# Patient Record
Sex: Female | Born: 1935
Health system: Southern US, Community
[De-identification: ages and names within clinical notes are randomized; demographics above are authoritative.]

## PROBLEM LIST (undated history)

## (undated) DIAGNOSIS — F32A Depression, unspecified: Secondary | ICD-10-CM

## (undated) DIAGNOSIS — E785 Hyperlipidemia, unspecified: Secondary | ICD-10-CM

## (undated) DIAGNOSIS — D649 Anemia, unspecified: Secondary | ICD-10-CM

## (undated) DIAGNOSIS — C50919 Malignant neoplasm of unspecified site of unspecified female breast: Secondary | ICD-10-CM

## (undated) DIAGNOSIS — C4490 Unspecified malignant neoplasm of skin, unspecified: Secondary | ICD-10-CM

## (undated) DIAGNOSIS — N9489 Other specified conditions associated with female genital organs and menstrual cycle: Secondary | ICD-10-CM

## (undated) DIAGNOSIS — I10 Essential (primary) hypertension: Secondary | ICD-10-CM

## (undated) DIAGNOSIS — G4730 Sleep apnea, unspecified: Secondary | ICD-10-CM

## (undated) DIAGNOSIS — C189 Malignant neoplasm of colon, unspecified: Secondary | ICD-10-CM

## (undated) DIAGNOSIS — K219 Gastro-esophageal reflux disease without esophagitis: Secondary | ICD-10-CM

## (undated) HISTORY — PX: COLONOSCOPY: SHX174

## (undated) HISTORY — PX: BREAST BIOPSY: SHX20

---

## 1993-08-22 HISTORY — PX: COLON RESECTION: SHX5231

## 2004-10-20 ENCOUNTER — Ambulatory Visit: Payer: Self-pay | Admitting: Internal Medicine

## 2005-10-25 ENCOUNTER — Ambulatory Visit: Payer: Self-pay | Admitting: Internal Medicine

## 2005-10-27 ENCOUNTER — Ambulatory Visit: Payer: Self-pay | Admitting: Internal Medicine

## 2006-02-02 ENCOUNTER — Ambulatory Visit: Payer: Self-pay | Admitting: Unknown Physician Specialty

## 2006-05-10 ENCOUNTER — Ambulatory Visit: Payer: Self-pay | Admitting: Internal Medicine

## 2006-10-27 ENCOUNTER — Ambulatory Visit: Payer: Self-pay | Admitting: Internal Medicine

## 2007-12-11 ENCOUNTER — Ambulatory Visit: Payer: Self-pay | Admitting: Internal Medicine

## 2008-01-15 ENCOUNTER — Ambulatory Visit: Payer: Self-pay | Admitting: Unknown Physician Specialty

## 2008-04-10 ENCOUNTER — Ambulatory Visit: Payer: Self-pay | Admitting: Internal Medicine

## 2008-04-28 ENCOUNTER — Ambulatory Visit: Payer: Self-pay | Admitting: Internal Medicine

## 2008-12-16 ENCOUNTER — Ambulatory Visit: Payer: Self-pay | Admitting: Internal Medicine

## 2010-01-01 ENCOUNTER — Ambulatory Visit: Payer: Self-pay | Admitting: Internal Medicine

## 2011-01-19 ENCOUNTER — Ambulatory Visit: Payer: Self-pay | Admitting: Internal Medicine

## 2012-02-15 ENCOUNTER — Ambulatory Visit: Payer: Self-pay | Admitting: Internal Medicine

## 2012-05-14 ENCOUNTER — Ambulatory Visit: Payer: Self-pay | Admitting: Unknown Physician Specialty

## 2013-12-25 ENCOUNTER — Ambulatory Visit: Payer: Self-pay | Admitting: Unknown Physician Specialty

## 2014-01-14 ENCOUNTER — Ambulatory Visit: Payer: Self-pay | Admitting: Internal Medicine

## 2014-03-03 ENCOUNTER — Ambulatory Visit: Payer: Self-pay | Admitting: Unknown Physician Specialty

## 2014-03-06 LAB — PATHOLOGY REPORT

## 2014-08-28 ENCOUNTER — Ambulatory Visit: Payer: Self-pay | Admitting: Specialist

## 2014-09-09 ENCOUNTER — Ambulatory Visit: Payer: Self-pay | Admitting: Internal Medicine

## 2014-09-11 ENCOUNTER — Ambulatory Visit: Payer: Self-pay | Admitting: Obstetrics and Gynecology

## 2014-09-11 LAB — BASIC METABOLIC PANEL
ANION GAP: 8 (ref 7–16)
BUN: 34 mg/dL — ABNORMAL HIGH (ref 7–18)
CHLORIDE: 107 mmol/L (ref 98–107)
CREATININE: 1.01 mg/dL (ref 0.60–1.30)
Calcium, Total: 8.7 mg/dL (ref 8.5–10.1)
Co2: 25 mmol/L (ref 21–32)
EGFR (African American): >60
EGFR (Non-African Amer.): 57 — ABNORMAL LOW
Glucose: 76 mg/dL (ref 65–99)
OSMOLALITY: 286 (ref 275–301)
Potassium: 4.5 mmol/L (ref 3.5–5.1)
Sodium: 140 mmol/L (ref 136–145)

## 2014-09-11 LAB — HEMOGLOBIN: HGB: 12.3 g/dL (ref 12.0–16.0)

## 2014-09-18 ENCOUNTER — Ambulatory Visit: Payer: Self-pay | Admitting: Anesthesiology

## 2014-09-22 ENCOUNTER — Ambulatory Visit: Payer: Self-pay | Admitting: Obstetrics and Gynecology

## 2014-09-24 LAB — CA 125: CA 125: 20.5 U/mL (ref 0.0–34.0)

## 2014-12-05 ENCOUNTER — Other Ambulatory Visit: Payer: Self-pay | Admitting: Specialist

## 2014-12-05 DIAGNOSIS — R918 Other nonspecific abnormal finding of lung field: Secondary | ICD-10-CM

## 2014-12-15 LAB — SURGICAL PATHOLOGY

## 2014-12-21 NOTE — Op Note (Signed)
PATIENT NAME:  Martha Munoz, Martha Munoz MR#:  [id number] DATE OF BIRTH:  [birth year]  DATE OF PROCEDURE:  09/22/2014   PREOPERATIVE DIAGNOSIS: Complex right ovarian cyst.   POSTOPERATIVE DIAGNOSES:  1.  Complex right ovarian cyst.  2.  Significant pelvic and abdominal adhesions.   PROCEDURES:  1.  Laparoscopic bilateral salpingectomy. 2.  Laparoscopic lysis of adhesions encompassing greater than 50% of total operating time.   ANESTHESIA:  General endotracheal.   SURGEON: Boykin Nearing, MD   FIRST ASSISTANTLeafy Ro.   INDICATIONS: This is a 79 year old gravida 0 patient noted to have a complex right ovarian cyst with papillation noted. The patient has been consented for bilateral salpingo-oophorectomy. The patient is status post partial colon resection.   DESCRIPTION OF PROCEDURE: After adequate general endotracheal anesthesia, the patient was placed in the dorsal supine position with the legs in the [place] stirrups. SCDs were on each leg bilaterally. The patient's abdomen, perineum, and vagina were prepped. The patient was sterilely draped.  Straight catheterization of the bladder yielded 100 mL of clear urine.  A sponge stick was placed into the vagina to be used for uterine manipulation during the procedure.  A 12 mm infraumbilical incision was made after injecting with 0.5% Marcaine and 0.25% Marcaine. The laparoscope was advanced into the abdominal cavity under direct visualization with the Optiview cannula. The patient was placed in slight Trendelenburg and second port was placed the left lower quadrant, 11 mm port was advanced 3 cm medial to the left anterior iliac spine and under direct visualization the 11 mm port was advanced into the abdominal cavity.  A similar procedure was performed with a 5 mm port on the right lower quadrant, again the port was placed 3 cm medial to the right anterior iliac spine.  Initial impression showed multiple adhesions with the left colon adhesed to the  left pelvic sidewall, multiple dense adhesions noted there and multiple small bowel adhesions draped and adhesed to the anterior abdominal wall. Very close to the infraumbilical port site.  The patient's right fallopian tube and ovary were identified and a 3 x 2 cm ovarian cyst without any excrescences were noted.  The fallopian tube was placed on medial traction, while the infundibulopelvic ligament was cauterized and transected with the Harmonic scalpel. Right fallopian tube and ovary were removed intact.  A similar procedure was performed on the patient's left ovary after an adhesiolysis occurred of the colon to the left sidewall and ultimately the infundibulopelvic ligament was identified.  It was cauterized and transected and the left fallopian tube and ovary were removed.  Ureteral function was ascertained before and after the bilateral salpingo-oophorectomy.  Good hemostasis was noted. Additional adhesiolysis was performed on the small bowel adhesed to the anterior abdominal wall.  Very close proximity of the bowel to the abdominal wall required meticulous dissection. Ultimately, the small bowel was freed and good hemostasis was noted. The patient's abdomen was irrigated and suctioned.  The pressure was lowered to 7 mmHg and good hemostasis was noted. The left and right fallopian tube and ovaries were removed with the Endobag through the left lower port site.  The patient's abdomen was then deflated and all ports were removed. The infraumbilical fascia and the left lower port site fascia was closed with a 2-0 Vicryl suture and all skin incisions were closed with interrupted 4-0 Vicryl suture.  Dermabond was placed on all incisions with a Tegaderm dressing. The Foley catheter was removed.    TOTAL URINE OUTPUT:  250 mL. Sponge stick was removed.   INTRAOPERATIVE FLUIDS: 800 mL.   ESTIMATED BLOOD LOSS: 5 mL   DISPOSITION:  The patient tolerated the procedure well and was taken to the recovery room in  good condition.     ____________________________ Boykin Nearing, MD tjs:DT D: 09/22/2014 13:25:00 ET T: 09/22/2014 15:05:26 ET JOB#: [id number]  cc: Boykin Nearing, MD, <Dictator> Boykin Nearing MD ELECTRONICALLY SIGNED 09/26/2014 12:46

## 2015-02-13 ENCOUNTER — Other Ambulatory Visit: Payer: Self-pay

## 2015-02-13 ENCOUNTER — Emergency Department: Payer: Medicare Other

## 2015-02-13 ENCOUNTER — Emergency Department
Admission: EM | Admit: 2015-02-13 | Discharge: 2015-02-13 | Disposition: A | Discharge status: Home / Self Care | Payer: Medicare Other | Attending: Emergency Medicine | Admitting: Emergency Medicine

## 2015-02-13 ENCOUNTER — Encounter: Payer: Self-pay | Admitting: Emergency Medicine

## 2015-02-13 DIAGNOSIS — K222 Esophageal obstruction: Secondary | ICD-10-CM | POA: Insufficient documentation

## 2015-02-13 DIAGNOSIS — R0789 Other chest pain: Secondary | ICD-10-CM | POA: Diagnosis present

## 2015-02-13 DIAGNOSIS — I10 Essential (primary) hypertension: Secondary | ICD-10-CM | POA: Insufficient documentation

## 2015-02-13 DIAGNOSIS — R12 Heartburn: Secondary | ICD-10-CM | POA: Diagnosis not present

## 2015-02-13 DIAGNOSIS — Z792 Long term (current) use of antibiotics: Secondary | ICD-10-CM | POA: Diagnosis not present

## 2015-02-13 DIAGNOSIS — Z79899 Other long term (current) drug therapy: Secondary | ICD-10-CM | POA: Insufficient documentation

## 2015-02-13 HISTORY — DX: Essential (primary) hypertension: I10

## 2015-02-13 HISTORY — DX: Hyperlipidemia, unspecified: E78.5

## 2015-02-13 HISTORY — DX: Malignant neoplasm of colon, unspecified: C18.9

## 2015-02-13 LAB — CBC
HCT: 36.1 % (ref 35.0–47.0)
Hemoglobin: 11.8 g/dL — ABNORMAL LOW (ref 12.0–16.0)
MCH: 31 pg (ref 26.0–34.0)
MCHC: 32.9 g/dL (ref 32.0–36.0)
MCV: 94.3 fL (ref 80.0–100.0)
PLATELETS: 270 10*3/uL (ref 150–440)
RBC: 3.83 MIL/uL (ref 3.80–5.20)
RDW: 13.3 % (ref 11.5–14.5)
WBC: 5.6 10*3/uL (ref 3.6–11.0)

## 2015-02-13 LAB — COMPREHENSIVE METABOLIC PANEL
ALT: 13 U/L — AB (ref 14–54)
AST: 24 U/L (ref 15–41)
Albumin: 3.7 g/dL (ref 3.5–5.0)
Alkaline Phosphatase: 65 U/L (ref 38–126)
Anion gap: 8 (ref 5–15)
BUN: 27 mg/dL — ABNORMAL HIGH (ref 6–20)
CO2: 25 mmol/L (ref 22–32)
Calcium: 9.1 mg/dL (ref 8.9–10.3)
Chloride: 107 mmol/L (ref 101–111)
Creatinine, Ser: 0.87 mg/dL (ref 0.44–1.00)
GFR calc Af Amer: >60 mL/min (ref >60)
GFR calc non Af Amer: >60 mL/min (ref >60)
Glucose, Bld: 103 mg/dL — ABNORMAL HIGH (ref 65–99)
POTASSIUM: 4.5 mmol/L (ref 3.5–5.1)
SODIUM: 140 mmol/L (ref 135–145)
TOTAL PROTEIN: 6.6 g/dL (ref 6.5–8.1)
Total Bilirubin: 0.7 mg/dL (ref 0.3–1.2)

## 2015-02-13 LAB — TROPONIN I

## 2015-02-13 NOTE — ED Notes (Signed)
Patient transported to X-ray 

## 2015-02-13 NOTE — ED Provider Notes (Signed)
[facility] Emergency Department Provider Note  ____________________________________________  Time seen: On arrival  I have reviewed the triage vital signs and the nursing notes.   HISTORY  Chief Complaint Chest Pain and Heartburn     HPI Martha Munoz is a 79 y.o. female who presents with complaints of swallowing difficulty for approximately one week. Patient reports that anytime she swallows liquid or solid she develops a discomfort in her lower chest just above the epigastrium. She has never had this before. She has had heartburn for many years. Her last endoscopy was 2 years ago. She sees Dr. Vira Agar. She has no nausea no vomiting. No shortness of breath. No history of coronary artery disease. No fevers or chills. She only has the pain when swallowing. She was sent from urgent care for eval     Past Medical History  Diagnosis Date  . Hypertension   . Hyperlipidemia   . Colon cancer     There are no active problems to display for this patient.   History reviewed. No pertinent past surgical history.  Current Outpatient Rx  Name  Route  Sig  Dispense  Refill  . cetirizine (ZYRTEC) 10 MG tablet   Oral   Take 10 mg by mouth daily.         Marland Kitchen doxycycline (VIBRAMYCIN) 100 MG capsule   Oral   Take 100 mg by mouth daily.         Marland Kitchen losartan (COZAAR) 100 MG tablet   Oral   Take 100 mg by mouth daily.         . metoprolol succinate (TOPROL-XL) 50 MG 24 hr tablet   Oral   Take 25 mg by mouth daily. Take with or immediately following a meal.         . ranitidine (ZANTAC) 150 MG tablet   Oral   Take 150 mg by mouth daily as needed for heartburn.         . simvastatin (ZOCOR) 20 MG tablet   Oral   Take 20 mg by mouth daily.           Allergies Review of patient's allergies indicates no known allergies.  Family History  Problem Relation Age of Onset  . CVA Mother   . Heart attack Father     Social History History   Substance Use Topics  . Smoking status: Never Smoker   . Smokeless tobacco: Not on file  . Alcohol Use: Yes    Review of Systems  Constitutional: Negative for fever. Eyes: Negative for visual changes. ENT: Negative for sore throat Cardiovascular: As discomfort Respiratory: Negative for shortness of breath. Gastrointestinal: Negative for abdominal pain, vomiting and diarrhea. Difficulty swallowing Genitourinary: Negative for dysuria. Musculoskeletal: Negative for back pain. Skin: Negative for rash. Neurological: Negative for headaches or focal weakness Psychiatric: No anxiety  10-point ROS otherwise negative.  ____________________________________________   PHYSICAL EXAM:  VITAL SIGNS: ED Triage Vitals  Enc Vitals Group     BP 02/13/15 0941 146/98 mmHg     Pulse Rate 02/13/15 0941 72     Resp 02/13/15 0941 20     Temp 02/13/15 0941 98 F (36.7 C)     Temp Source 02/13/15 0941 Oral     SpO2 02/13/15 0941 96 %     Weight 02/13/15 0941 138 lb (62.596 kg)     Height 02/13/15 0941 5\' 1"  (1.549 m)     Head Cir --      Peak  Flow --      Pain Score 02/13/15 0942 3     Pain Loc --      Pain Edu? --      Excl. in [place]? --     Constitutional: Alert and oriented. Well appearing and in no distress. Eyes: Conjunctivae are normal.  ENT   Head: Normocephalic and atraumatic.   Mouth/Throat: Mucous membranes are moist. Cardiovascular: Normal rate, regular rhythm. Normal and symmetric distal pulses are present in all extremities. No murmurs, rubs, or gallops. Respiratory: Normal respiratory effort without tachypnea nor retractions. Breath sounds are clear and equal bilaterally.  Gastrointestinal: Soft and non-tender in all quadrants. No distention. There is no CVA tenderness. Genitourinary: deferred Musculoskeletal: Nontender with normal range of motion in all extremities. No lower extremity tenderness nor edema. Neurologic:  Normal speech and language. No gross focal  neurologic deficits are appreciated. Skin:  Skin is warm, dry and intact. No rash noted. Psychiatric: Mood and affect are normal. Patient exhibits appropriate insight and judgment.  ____________________________________________    LABS (pertinent positives/negatives)  Labs Reviewed  CBC - Abnormal; Notable for the following:    Hemoglobin 11.8 (*)    All other components within normal limits  COMPREHENSIVE METABOLIC PANEL - Abnormal; Notable for the following:    Glucose, Bld 103 (*)    BUN 27 (*)    ALT 13 (*)    All other components within normal limits  TROPONIN I    ____________________________________________   EKG   ED ECG REPORT I, Lavonia Drafts, the attending physician, personally viewed and interpreted this ECG.   Date: 02/13/2015  EKG Time: 9:43 AM  Rate: 72  Rhythm: normal sinus rhythm, left axis deviation  Axis: Left Axis deviation  Intervals:none  ST&T Change: None   ____________________________________________    RADIOLOGY  Chest x-ray unremarkable, reviewed by me  ____________________________________________   PROCEDURES  Procedure(s) performed: none  Critical Care performed: none  ____________________________________________   INITIAL IMPRESSION / ASSESSMENT AND PLAN / ED COURSE  Pertinent labs & imaging results that were available during my care of the patient were reviewed by me and considered in my medical decision making (see chart for details).  Patient well-appearing. Chest discomfort not consistent with ACS, more consistent with likely esophageal stricture. Patient has relationship with Dr. Vira Agar will refer her to him. EKG troponin negative chest x-ray normal. Patient anxious for discharge    ____________________________________________   FINAL CLINICAL IMPRESSION(S) / ED DIAGNOSES  Final diagnoses:  Esophageal stricture     Lavonia Drafts, MD 02/13/15 1220

## 2015-02-13 NOTE — ED Notes (Signed)
Pt to ed with c/o heartburn intermittently x 1 week.  Pt states she has pain more after eating in center of chest and describes pain as burning.  Pt denies sob, denies weakness, denies dizziness assoc with chest pain.

## 2015-02-13 NOTE — Discharge Instructions (Signed)
Esophageal Stricture °The esophagus is the long, narrow tube which carries food and liquid from the mouth to the stomach. Sometimes a part of the esophagus becomes narrow and makes it difficult, painful, or even impossible to swallow. This is called an esophageal stricture.  °CAUSES  °Common causes of blockage or strictures of the esophagus are: °· Exposure of the lower esophagus to the acid from the stomach may cause narrowing. °· Hiatal hernia in which a small part of the stomach bulges up through the diaphragm can cause a narrowing in the bottom of the esophagus. °· Scleroderma is a tissue disorder that affects the esophagus and makes swallowing difficult. °· Achalasia is an absence of nerves in the lower esophagus and to the esophageal sphincter. This absence of nerves may be congenital (present since birth). This can cause irregular spasms which do not allow food and fluid through. °· Strictures may develop from swallowing materials which damage the esophagus. Examples are acids or alkalis such as lye. °· Schatzki's Ring is a narrow ring of non-cancerous tissue which narrows the lower esophagus. The cause of this is unknown. °· Growths can block the esophagus. °SYMPTOMS  °Some of the problems are difficulty swallowing or pain with swallowing. °DIAGNOSIS  °Your caregiver often suspects this problem by taking a medical history. They will also do a physical exam. They may then take X-rays and/or perform an endoscopy. Endoscopy is an exam in which a tube like a small flexible telescope is used to look at your esophagus.  °TREATMENT °· One form of treatment is to dilate the narrow area. This means to stretch it. °· When this is not successful, chest surgery may be required. This is a much more extensive form of treatment with a longer recovery time. °Both of the above treatments make the passage of food and water into the stomach easier. They also make it easier for stomach contents to bubble back into the  esophagus. Special medications may be used following the procedure to help prevent further narrowing. Medications may be used to lower the amount of acid in the stomach juice.  °SEEK IMMEDIATE MEDICAL CARE IF:  °· Your swallowing is becoming more painful, difficult, or you are unable to swallow. °· You vomit up blood. °· You develop black tarry stools. °· You develop chills. °· You have a fever. °· You develop chest or abdominal pain. °· You develop shortness of breath, feel lightheaded, or faint. °Follow up with medical care as your caregiver suggests. °Document Released: 04/18/2006 Document Revised: 10/31/2011 Document Reviewed: 12/18/2013 °[employer]® Patient Information ©2015 [employer]. This information is not intended to replace advice given to you by your health care provider. Make sure you discuss any questions you have with your health care provider. ° °

## 2015-02-20 ENCOUNTER — Other Ambulatory Visit: Payer: Self-pay | Admitting: Unknown Physician Specialty

## 2015-02-20 ENCOUNTER — Other Ambulatory Visit (HOSPITAL_COMMUNITY): Payer: Self-pay | Admitting: Unknown Physician Specialty

## 2015-02-20 ENCOUNTER — Ambulatory Visit: Payer: Medicare Other

## 2015-02-20 DIAGNOSIS — R1310 Dysphagia, unspecified: Secondary | ICD-10-CM

## 2015-02-24 ENCOUNTER — Ambulatory Visit
Admission: RE | Admit: 2015-02-24 | Discharge: 2015-02-24 | Disposition: A | Discharge status: Home / Self Care | Payer: Medicare Other | Source: Ambulatory Visit | Attending: Unknown Physician Specialty | Admitting: Unknown Physician Specialty

## 2015-02-24 DIAGNOSIS — R1310 Dysphagia, unspecified: Secondary | ICD-10-CM | POA: Insufficient documentation

## 2015-08-05 ENCOUNTER — Ambulatory Visit
Admission: RE | Admit: 2015-08-05 | Discharge: 2015-08-05 | Disposition: A | Discharge status: Home / Self Care | Payer: Medicare Other | Source: Ambulatory Visit | Attending: Specialist | Admitting: Specialist

## 2015-08-05 ENCOUNTER — Other Ambulatory Visit: Payer: Self-pay | Admitting: Specialist

## 2015-08-05 ENCOUNTER — Ambulatory Visit: Payer: Medicare Other

## 2015-08-05 DIAGNOSIS — I2510 Atherosclerotic heart disease of native coronary artery without angina pectoris: Secondary | ICD-10-CM | POA: Diagnosis not present

## 2015-08-05 DIAGNOSIS — R911 Solitary pulmonary nodule: Secondary | ICD-10-CM | POA: Diagnosis not present

## 2015-08-05 DIAGNOSIS — R918 Other nonspecific abnormal finding of lung field: Secondary | ICD-10-CM

## 2015-12-31 DIAGNOSIS — Z23 Encounter for immunization: Secondary | ICD-10-CM | POA: Diagnosis not present

## 2015-12-31 DIAGNOSIS — Z0000 Encounter for general adult medical examination without abnormal findings: Secondary | ICD-10-CM | POA: Diagnosis not present

## 2015-12-31 DIAGNOSIS — R739 Hyperglycemia, unspecified: Secondary | ICD-10-CM | POA: Diagnosis not present

## 2015-12-31 DIAGNOSIS — E7800 Pure hypercholesterolemia, unspecified: Secondary | ICD-10-CM | POA: Diagnosis not present

## 2015-12-31 DIAGNOSIS — R911 Solitary pulmonary nodule: Secondary | ICD-10-CM | POA: Diagnosis not present

## 2015-12-31 DIAGNOSIS — I10 Essential (primary) hypertension: Secondary | ICD-10-CM | POA: Diagnosis not present

## 2016-01-06 DIAGNOSIS — R7301 Impaired fasting glucose: Secondary | ICD-10-CM | POA: Diagnosis not present

## 2016-01-06 DIAGNOSIS — R911 Solitary pulmonary nodule: Secondary | ICD-10-CM | POA: Diagnosis not present

## 2016-01-06 DIAGNOSIS — E538 Deficiency of other specified B group vitamins: Secondary | ICD-10-CM | POA: Diagnosis not present

## 2016-01-06 DIAGNOSIS — N390 Urinary tract infection, site not specified: Secondary | ICD-10-CM | POA: Diagnosis not present

## 2016-01-06 DIAGNOSIS — I10 Essential (primary) hypertension: Secondary | ICD-10-CM | POA: Diagnosis not present

## 2016-01-12 DIAGNOSIS — X32XXXA Exposure to sunlight, initial encounter: Secondary | ICD-10-CM | POA: Diagnosis not present

## 2016-01-12 DIAGNOSIS — L821 Other seborrheic keratosis: Secondary | ICD-10-CM | POA: Diagnosis not present

## 2016-01-12 DIAGNOSIS — L570 Actinic keratosis: Secondary | ICD-10-CM | POA: Diagnosis not present

## 2016-01-12 DIAGNOSIS — L718 Other rosacea: Secondary | ICD-10-CM | POA: Diagnosis not present

## 2016-02-28 DIAGNOSIS — L255 Unspecified contact dermatitis due to plants, except food: Secondary | ICD-10-CM | POA: Diagnosis not present

## 2016-03-07 DIAGNOSIS — L239 Allergic contact dermatitis, unspecified cause: Secondary | ICD-10-CM | POA: Diagnosis not present

## 2016-03-10 IMAGING — RF DG ESOPHAGUS
10 of 14 series · 15 of 24 positions shown · non-contrast
Comparison: None.

CLINICAL DATA: Dysphasia.

EXAM:
ESOPHOGRAM/BARIUM SWALLOW
TECHNIQUE: Combined double contrast and single contrast examination performed
using effervescent crystals, thick barium liquid, and thin barium
liquid.
FLUOROSCOPY TIME:  Fluoroscopy Time:  1 minutes and 6 seconds
Number of Acquired Images:  22

[Series 1: fluoro_barium 2fps_bw · 0.18mm/px · 2 of 7 frames shown (1 of 10)]
[frame 2/7]
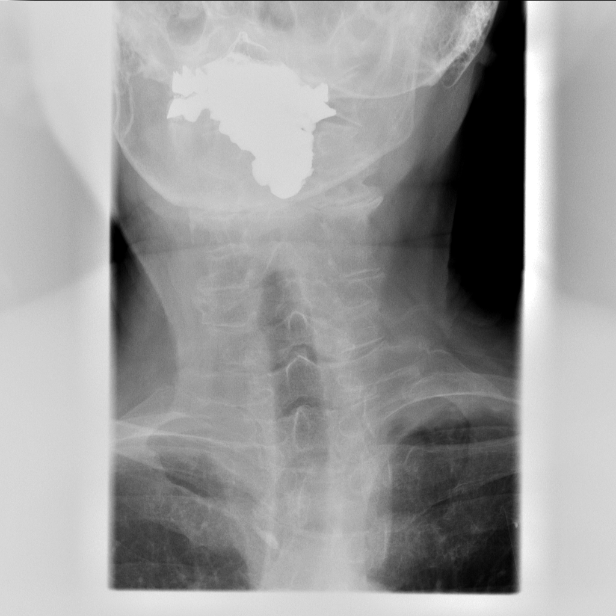
[frame 4/7]
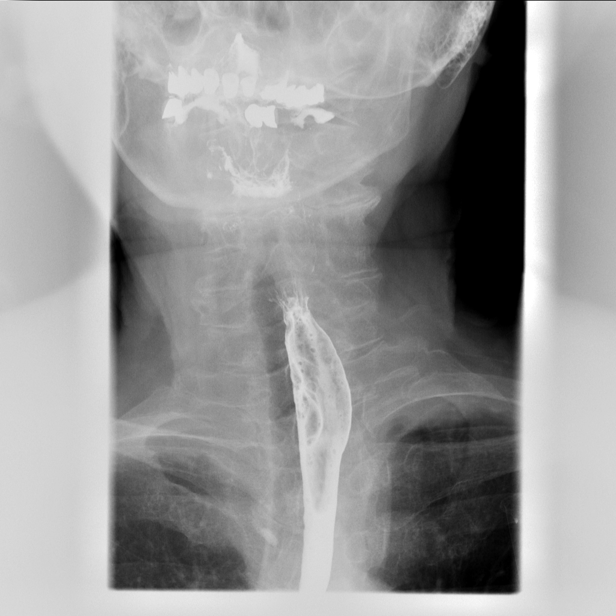

[Series 2: fluoro_barium 2fps_bw · 0.18mm/px · 2 of 5 frames shown (2 of 10)]
[frame 3/5]
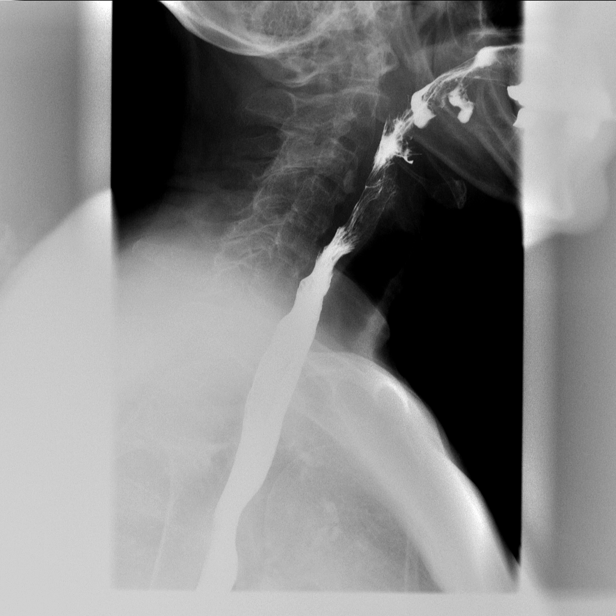
[frame 5/5]
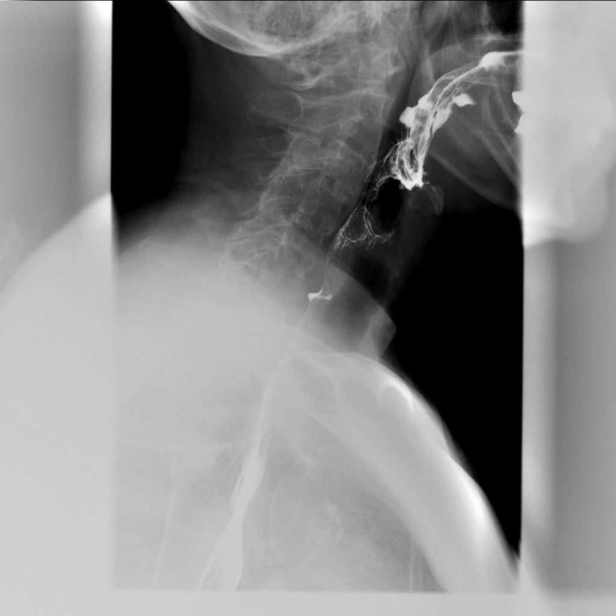

[Series 4: fluoro_barium 2fps_bw · 0.18mm/px · 1 of 1 slices shown (3 of 10)]
[im 1/1]
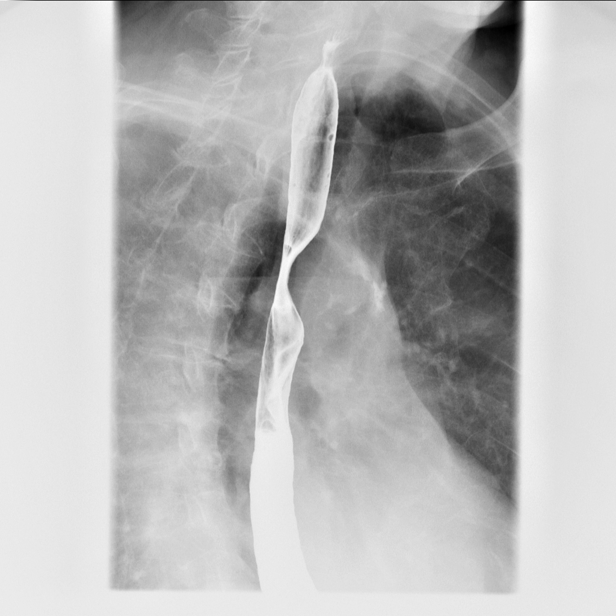

[Series 5: fluoro_barium 2fps_bw · 0.18mm/px · 1 of 1 slices shown (4 of 10)]
[im 1/1]
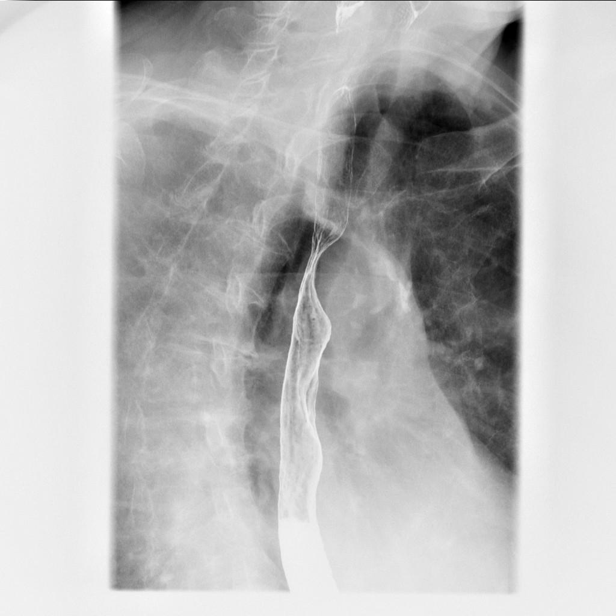

[Series 7: fluoro_barium 2fps_bw · 0.18mm/px · 1 of 1 slices shown (5 of 10)]
[im 1/1]
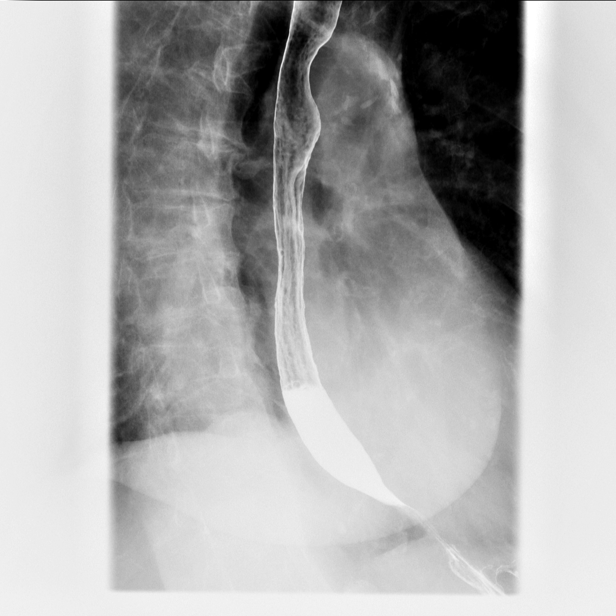

[Series 10: fluoro_barium 2fps_bw · 0.19mm/px · 1 of 1 slices shown (6 of 10)]
[im 1/1]
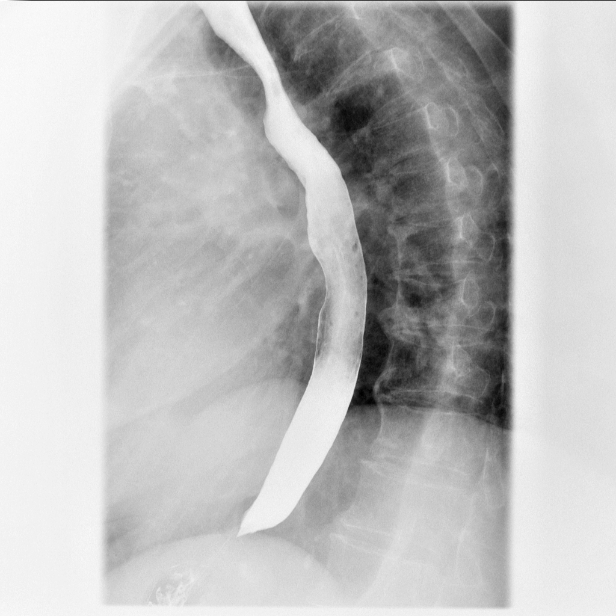

[Series 11: fluoro_barium 2fps_bw · 0.19mm/px · 1 of 1 slices shown (7 of 10)]
[im 1/1]
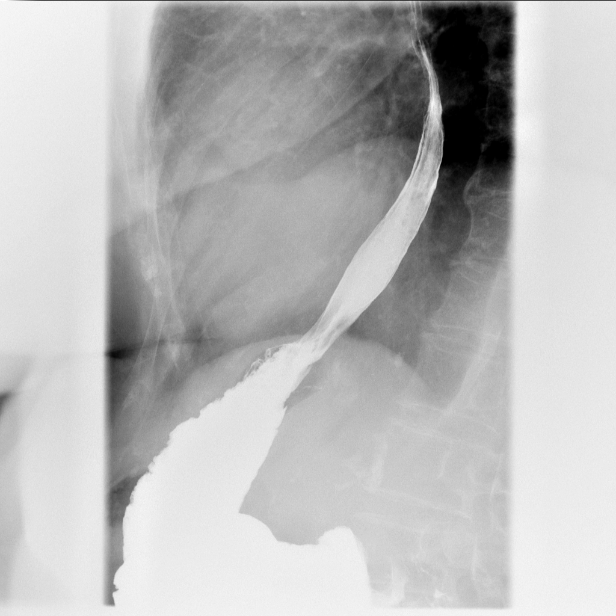

[Series 14: fluoro_barium 2fps_bw · 0.19mm/px · 2 of 4 frames shown (8 of 10)]
[frame 1/4]
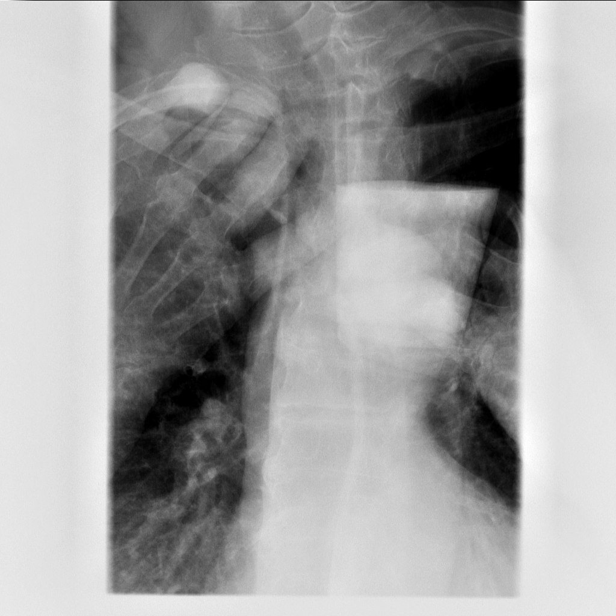
[frame 3/4]
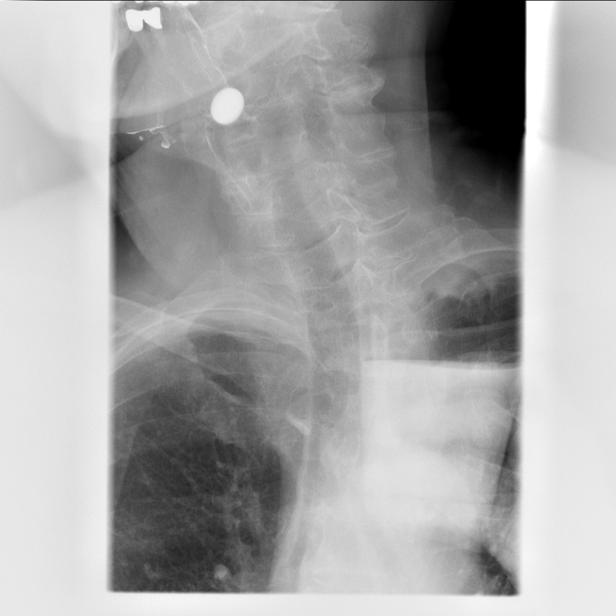

[Series 15: fluoro_barium 2fps_bw · 0.19mm/px · 2 of 15 frames shown (9 of 10)]
[frame 2/15]
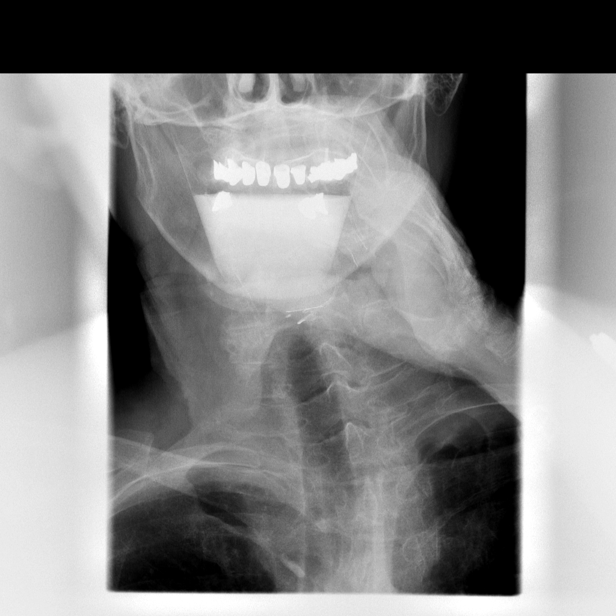
[frame 13/15]
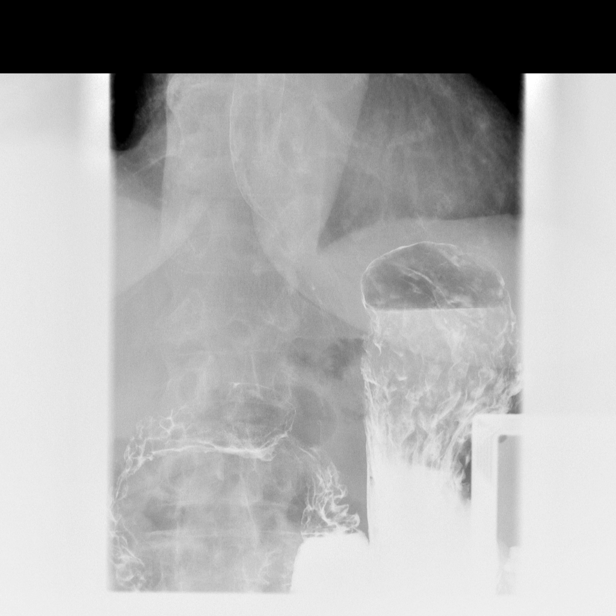

[Series 16: fluoro_barium 2fps_bw · 0.19mm/px · 2 of 3 frames shown (10 of 10)]
[frame 1/3]
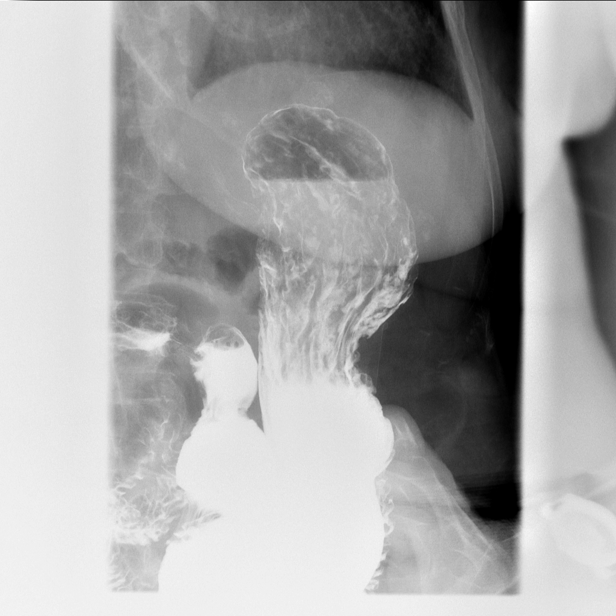
[frame 3/3]
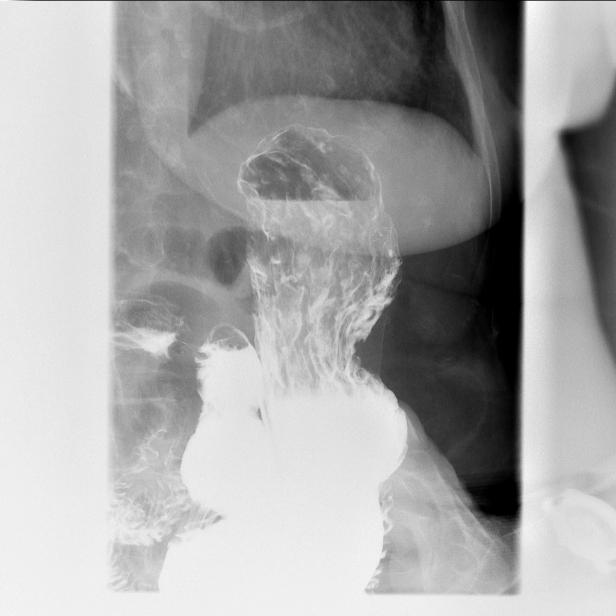

[15 of 24 positions shown; findings below may reference images not displayed]

FINDINGS: Cervical and thoracic esophageal mucosal pattern, peristaltic
activity, and contour normal. No reflux. No obstructing lesion.
Standardized barium tablet passed easily.
IMPRESSION: Normal exam .

## 2016-09-13 DIAGNOSIS — R7301 Impaired fasting glucose: Secondary | ICD-10-CM | POA: Diagnosis not present

## 2016-09-13 DIAGNOSIS — R911 Solitary pulmonary nodule: Secondary | ICD-10-CM | POA: Diagnosis not present

## 2016-09-13 DIAGNOSIS — N390 Urinary tract infection, site not specified: Secondary | ICD-10-CM | POA: Diagnosis not present

## 2016-09-13 DIAGNOSIS — I10 Essential (primary) hypertension: Secondary | ICD-10-CM | POA: Diagnosis not present

## 2016-09-13 DIAGNOSIS — E538 Deficiency of other specified B group vitamins: Secondary | ICD-10-CM | POA: Diagnosis not present

## 2016-09-14 DIAGNOSIS — Z85038 Personal history of other malignant neoplasm of large intestine: Secondary | ICD-10-CM | POA: Diagnosis not present

## 2016-09-14 DIAGNOSIS — M159 Polyosteoarthritis, unspecified: Secondary | ICD-10-CM | POA: Diagnosis not present

## 2016-09-14 DIAGNOSIS — I10 Essential (primary) hypertension: Secondary | ICD-10-CM | POA: Diagnosis not present

## 2016-09-14 DIAGNOSIS — R739 Hyperglycemia, unspecified: Secondary | ICD-10-CM | POA: Diagnosis not present

## 2016-09-14 DIAGNOSIS — Z0000 Encounter for general adult medical examination without abnormal findings: Secondary | ICD-10-CM | POA: Diagnosis not present

## 2016-09-14 DIAGNOSIS — Z1231 Encounter for screening mammogram for malignant neoplasm of breast: Secondary | ICD-10-CM | POA: Diagnosis not present

## 2016-12-18 DIAGNOSIS — L255 Unspecified contact dermatitis due to plants, except food: Secondary | ICD-10-CM | POA: Diagnosis not present

## 2017-01-11 DIAGNOSIS — D2261 Melanocytic nevi of right upper limb, including shoulder: Secondary | ICD-10-CM | POA: Diagnosis not present

## 2017-01-11 DIAGNOSIS — L718 Other rosacea: Secondary | ICD-10-CM | POA: Diagnosis not present

## 2017-01-11 DIAGNOSIS — L821 Other seborrheic keratosis: Secondary | ICD-10-CM | POA: Diagnosis not present

## 2017-01-11 DIAGNOSIS — D225 Melanocytic nevi of trunk: Secondary | ICD-10-CM | POA: Diagnosis not present

## 2017-01-23 DIAGNOSIS — R21 Rash and other nonspecific skin eruption: Secondary | ICD-10-CM | POA: Diagnosis not present

## 2017-03-09 DIAGNOSIS — R8290 Unspecified abnormal findings in urine: Secondary | ICD-10-CM | POA: Diagnosis not present

## 2017-03-09 DIAGNOSIS — Z0000 Encounter for general adult medical examination without abnormal findings: Secondary | ICD-10-CM | POA: Diagnosis not present

## 2017-03-09 DIAGNOSIS — I10 Essential (primary) hypertension: Secondary | ICD-10-CM | POA: Diagnosis not present

## 2017-03-09 DIAGNOSIS — M159 Polyosteoarthritis, unspecified: Secondary | ICD-10-CM | POA: Diagnosis not present

## 2017-03-09 DIAGNOSIS — R739 Hyperglycemia, unspecified: Secondary | ICD-10-CM | POA: Diagnosis not present

## 2017-03-09 DIAGNOSIS — Z85038 Personal history of other malignant neoplasm of large intestine: Secondary | ICD-10-CM | POA: Diagnosis not present

## 2017-03-09 DIAGNOSIS — Z1231 Encounter for screening mammogram for malignant neoplasm of breast: Secondary | ICD-10-CM | POA: Diagnosis not present

## 2017-03-14 ENCOUNTER — Other Ambulatory Visit: Payer: Self-pay | Admitting: Internal Medicine

## 2017-03-14 DIAGNOSIS — R110 Nausea: Secondary | ICD-10-CM | POA: Diagnosis not present

## 2017-03-14 DIAGNOSIS — Z0000 Encounter for general adult medical examination without abnormal findings: Secondary | ICD-10-CM | POA: Diagnosis not present

## 2017-03-14 DIAGNOSIS — R531 Weakness: Secondary | ICD-10-CM | POA: Diagnosis not present

## 2017-03-14 DIAGNOSIS — I10 Essential (primary) hypertension: Secondary | ICD-10-CM

## 2017-03-14 DIAGNOSIS — R42 Dizziness and giddiness: Secondary | ICD-10-CM

## 2017-03-31 ENCOUNTER — Other Ambulatory Visit: Payer: Medicare Other

## 2017-04-07 DIAGNOSIS — R110 Nausea: Secondary | ICD-10-CM | POA: Diagnosis not present

## 2017-04-07 DIAGNOSIS — I10 Essential (primary) hypertension: Secondary | ICD-10-CM | POA: Diagnosis not present

## 2017-04-07 DIAGNOSIS — R531 Weakness: Secondary | ICD-10-CM | POA: Diagnosis not present

## 2017-04-07 DIAGNOSIS — Z87440 Personal history of urinary (tract) infections: Secondary | ICD-10-CM | POA: Diagnosis not present

## 2017-04-07 DIAGNOSIS — I499 Cardiac arrhythmia, unspecified: Secondary | ICD-10-CM | POA: Diagnosis not present

## 2017-04-07 DIAGNOSIS — R42 Dizziness and giddiness: Secondary | ICD-10-CM | POA: Diagnosis not present

## 2017-04-18 DIAGNOSIS — I10 Essential (primary) hypertension: Secondary | ICD-10-CM | POA: Diagnosis not present

## 2017-04-18 DIAGNOSIS — R002 Palpitations: Secondary | ICD-10-CM | POA: Diagnosis not present

## 2017-04-18 DIAGNOSIS — E782 Mixed hyperlipidemia: Secondary | ICD-10-CM | POA: Diagnosis not present

## 2017-04-18 DIAGNOSIS — R55 Syncope and collapse: Secondary | ICD-10-CM | POA: Diagnosis not present

## 2017-04-25 DIAGNOSIS — R002 Palpitations: Secondary | ICD-10-CM | POA: Diagnosis not present

## 2017-04-25 DIAGNOSIS — R55 Syncope and collapse: Secondary | ICD-10-CM | POA: Diagnosis not present

## 2017-05-16 DIAGNOSIS — R42 Dizziness and giddiness: Secondary | ICD-10-CM | POA: Diagnosis not present

## 2017-05-16 DIAGNOSIS — E782 Mixed hyperlipidemia: Secondary | ICD-10-CM | POA: Diagnosis not present

## 2017-05-16 DIAGNOSIS — I493 Ventricular premature depolarization: Secondary | ICD-10-CM | POA: Diagnosis not present

## 2017-05-16 DIAGNOSIS — R55 Syncope and collapse: Secondary | ICD-10-CM | POA: Diagnosis not present

## 2017-05-16 DIAGNOSIS — R002 Palpitations: Secondary | ICD-10-CM | POA: Diagnosis not present

## 2017-05-16 DIAGNOSIS — I10 Essential (primary) hypertension: Secondary | ICD-10-CM | POA: Diagnosis not present

## 2017-05-16 DIAGNOSIS — I6523 Occlusion and stenosis of bilateral carotid arteries: Secondary | ICD-10-CM | POA: Diagnosis not present

## 2017-05-18 ENCOUNTER — Emergency Department: Payer: Medicare HMO

## 2017-05-18 ENCOUNTER — Emergency Department
Admission: EM | Admit: 2017-05-18 | Discharge: 2017-05-18 | Disposition: A | Discharge status: Home / Self Care | Payer: Medicare HMO | Attending: Emergency Medicine | Admitting: Emergency Medicine

## 2017-05-18 ENCOUNTER — Encounter: Payer: Self-pay | Admitting: Emergency Medicine

## 2017-05-18 DIAGNOSIS — R42 Dizziness and giddiness: Secondary | ICD-10-CM

## 2017-05-18 DIAGNOSIS — R0789 Other chest pain: Secondary | ICD-10-CM | POA: Insufficient documentation

## 2017-05-18 DIAGNOSIS — R079 Chest pain, unspecified: Secondary | ICD-10-CM | POA: Diagnosis not present

## 2017-05-18 DIAGNOSIS — Z79899 Other long term (current) drug therapy: Secondary | ICD-10-CM | POA: Diagnosis not present

## 2017-05-18 DIAGNOSIS — I10 Essential (primary) hypertension: Secondary | ICD-10-CM | POA: Diagnosis not present

## 2017-05-18 LAB — CBC
HEMATOCRIT: 36.5 % (ref 35.0–47.0)
HEMOGLOBIN: 12.5 g/dL (ref 12.0–16.0)
MCH: 32.4 pg (ref 26.0–34.0)
MCHC: 34.3 g/dL (ref 32.0–36.0)
MCV: 94.5 fL (ref 80.0–100.0)
Platelets: 229 10*3/uL (ref 150–440)
RBC: 3.86 MIL/uL (ref 3.80–5.20)
RDW: 13.3 % (ref 11.5–14.5)
WBC: 5 10*3/uL (ref 3.6–11.0)

## 2017-05-18 LAB — BASIC METABOLIC PANEL
Anion gap: 6 (ref 5–15)
BUN: 18 mg/dL (ref 6–20)
CHLORIDE: 104 mmol/L (ref 101–111)
CO2: 25 mmol/L (ref 22–32)
Calcium: 9.4 mg/dL (ref 8.9–10.3)
Creatinine, Ser: 0.74 mg/dL (ref 0.44–1.00)
GFR calc Af Amer: >60 mL/min (ref >60)
GFR calc non Af Amer: >60 mL/min (ref >60)
GLUCOSE: 162 mg/dL — AB (ref 65–99)
POTASSIUM: 3.9 mmol/L (ref 3.5–5.1)
Sodium: 135 mmol/L (ref 135–145)

## 2017-05-18 LAB — TROPONIN I
Troponin I: <0.03 ng/mL (ref <0.03)
Troponin I: <0.03 ng/mL (ref <0.03)

## 2017-05-18 MED ORDER — SUCRALFATE 1 G PO TABS
1.0000 g | ORAL_TABLET | Freq: Once | ORAL | Status: AC
  Administered 2017-05-18: 1 g via ORAL
  Filled 2017-05-18: qty 1

## 2017-05-18 MED ORDER — SODIUM CHLORIDE 0.9 % IV BOLUS (SEPSIS)
1000.0000 mL | Freq: Once | INTRAVENOUS | Status: AC
  Administered 2017-05-18: 1000 mL via INTRAVENOUS

## 2017-05-18 MED ORDER — FAMOTIDINE 20 MG PO TABS: 20.0000 mg | ORAL_TABLET | Freq: Two times a day (BID) | ORAL | 0 refills | Status: DC

## 2017-05-18 MED ORDER — FAMOTIDINE 20 MG PO TABS
40.0000 mg | ORAL_TABLET | Freq: Once | ORAL | Status: AC
  Administered 2017-05-18: 40 mg via ORAL
  Filled 2017-05-18: qty 2

## 2017-05-18 MED ORDER — DIAZEPAM 5 MG PO TABS
5.0000 mg | ORAL_TABLET | Freq: Once | ORAL | Status: AC
  Administered 2017-05-18: 5 mg via ORAL
  Filled 2017-05-18: qty 1

## 2017-05-18 MED ORDER — ALUMINUM-MAGNESIUM-SIMETHICONE 200-200-20 MG/5ML PO SUSP: 30.0000 mL | Freq: Three times a day (TID) | ORAL | 0 refills | Status: DC

## 2017-05-18 NOTE — Discharge Instructions (Signed)
Your MRI of the brain and your other tests today were unremarkable.  Follow up with your Dr. Ginette Pitman and Dr. Nehemiah Massed for continued monitoring of your symptoms.

## 2017-05-18 NOTE — ED Notes (Signed)
Patient transported to X-ray 

## 2017-05-18 NOTE — ED Notes (Signed)
Visitor at bedside.

## 2017-05-18 NOTE — ED Notes (Signed)
MRI Tech called and stated that she would call back 30 minutes prior to procedure so the patient can be medicated.

## 2017-05-18 NOTE — ED Notes (Signed)
Patient taken to MRI

## 2017-05-18 NOTE — ED Provider Notes (Signed)
[facility] Emergency Department Provider Note  ____________________________________________  Time seen: Approximately 12:39 PM  I have reviewed the triage vital signs and the nursing notes.   HISTORY  Chief Complaint Chest Pain    HPI Martha Munoz is a 81 y.o. female who complains of intermittent chest pressure for the past several weeks. It's nonradiating, no aggravating or alleviating factors. Not exertional, not pleuritic. Mild to moderate in intensity. Last for up to one hour at a time. Feels better after taking Zantac and drinking water.  With the chest pressure, she also reports intermittent dizziness, which today she describes as lightheadedness and feeling like she might pass out. She went to cardiology clinic 2 days ago where she did actually pass out on her way into the clinic for a visit. She was seen by cardiology, I reviewed their note in the electronic medical record system from Dr. Nehemiah Massed, which notes that she had a unremarkable Holter monitor, she had a cardiac echo in the clinic 2 days ago which was unremarkable, and they feel that her pain is noncardiac.  Patient denies any focal paresthesias or weakness. No vision changes. No headache.     Past Medical History:  Diagnosis Date  . Colon cancer ([place])   . Hyperlipidemia   . Hypertension      There are no active problems to display for this patient.    No past surgical history on file.   Prior to Admission medications   Medication Sig Start Date End Date Taking? Authorizing Provider  cetirizine (ZYRTEC) 10 MG tablet Take 10 mg by mouth daily.   Yes [provider]  losartan (COZAAR) 100 MG tablet Take 100 mg by mouth daily.   Yes [provider]  omeprazole (PRILOSEC) 20 MG capsule Take 20 mg by mouth daily.   Yes [provider]  simvastatin (ZOCOR) 20 MG tablet Take 20 mg by mouth daily.   Yes [provider]  aluminum-magnesium  hydroxide-simethicone (MAALOX) 200-200-20 MG/5ML SUSP Take 30 mLs by mouth 4 (four) times daily -  before meals and at bedtime. 05/18/17   Carrie Mew, MD  famotidine (PEPCID) 20 MG tablet Take 1 tablet (20 mg total) by mouth 2 (two) times daily. 05/18/17   Carrie Mew, MD     Allergies Patient has no known allergies.   Family History  Problem Relation Age of Onset  . CVA Mother   . Heart attack Father     Social History Social History  Substance Use Topics  . Smoking status: Never Smoker  . Smokeless tobacco: Not on file  . Alcohol use Yes    Review of Systems  Constitutional:   No fever or chills.  ENT:   No sore throat. No rhinorrhea. Cardiovascular:   positive as above chest pressure. Syncope 2 days ago, persistent lightheadedness intermittently.Marland Kitchen Respiratory:   No dyspnea or cough. Gastrointestinal:   Negative for abdominal pain, vomiting and diarrhea.  Musculoskeletal:   Negative for focal pain or swelling All other systems reviewed and are negative except as documented above in ROS and HPI.  ____________________________________________   PHYSICAL EXAM:  VITAL SIGNS: ED Triage Vitals  Enc Vitals Group     BP 05/18/17 0927 (!) 157/74     Pulse Rate 05/18/17 0927 93     Resp 05/18/17 0927 18     Temp 05/18/17 0927 98 F (36.7 C)     Temp Source 05/18/17 0927 Oral     SpO2 05/18/17 0927 96 %  Weight 05/18/17 0928 133 lb (60.3 kg)     Height 05/18/17 0928 5\' 1"  (1.549 m)     Head Circumference --      Peak Flow --      Pain Score --      Pain Loc --      Pain Edu? --      Excl. in [place]? --     Vital signs reviewed, nursing assessments reviewed.   Constitutional:   Alert and oriented. Well appearing and in no distress. Eyes:   No scleral icterus.  EOMI. No nystagmus. No conjunctival pallor. PERRL. ENT   Head:   Normocephalic and atraumatic.   Nose:   No congestion/rhinnorhea.    Mouth/Throat:   MMM, no pharyngeal erythema. No  peritonsillar mass.    Neck:   No meningismus. Full ROM Hematological/Lymphatic/Immunilogical:   No cervical lymphadenopathy. Cardiovascular:   RRR. Symmetric bilateral radial and DP pulses.  No murmurs.  Respiratory:   Normal respiratory effort without tachypnea/retractions. Breath sounds are clear and equal bilaterally. No wheezes/rales/rhonchi. Gastrointestinal:   Soft and nontender. Non distended. There is no CVA tenderness.  No rebound, rigidity, or guarding. Genitourinary:   deferred Musculoskeletal:   Normal range of motion in all extremities. No joint effusions.  No lower extremity tenderness.  No edema. Neurologic:   Normal speech and language.  Motor grossly intact. NIH stroke scale 0 No gross focal neurologic deficits are appreciated.  Skin:    Skin is warm, dry and intact. No rash noted.  No petechiae, purpura, or bullae.  ____________________________________________    LABS (pertinent positives/negatives) (all labs ordered are listed, but only abnormal results are displayed) Labs Reviewed  BASIC METABOLIC PANEL - Abnormal; Notable for the following:       Result Value   Glucose, Bld 162 (*)    All other components within normal limits  CBC  TROPONIN I  TROPONIN I   ____________________________________________   EKG  interpreted by me Sinus rhythm rate of 88, left axis, normal intervals. Normal QRS ST segments and T waves. Voltage criteria for LVH in the high lateral leads.  ____________________________________________    RADIOLOGY  Dg Chest 2 View  Result Date: 05/18/2017 CLINICAL DATA:  Chest pain.  Colon cancer. EXAM: CHEST  2 VIEW COMPARISON:  CT of 08/05/2015.  Chest radiograph 02/13/2015. FINDINGS: Mild hyperinflation. Lateral view degraded by patient arm position. Mild osteopenia. Moderate thoracic spondylosis. Midline trachea. Normal heart size. Atherosclerosis in the transverse aorta. No pleural effusion or pneumothorax. Biapical pleural thickening.  IMPRESSION: No acute cardiopulmonary disease. Aortic Atherosclerosis (ICD10-I70.0). Electronically Signed   By: Abigail Miyamoto M.D.   On: 05/18/2017 10:05   Mr Angiogram Head Wo Contrast  Result Date: 05/18/2017 CLINICAL DATA:  81 year old female with vertigo. Chest pressure, shortness of breath, dizziness and nausea for several weeks. EXAM: MRI HEAD WITHOUT CONTRAST MRA HEAD WITHOUT CONTRAST TECHNIQUE: Multiplanar, multiecho pulse sequences of the brain and surrounding structures were obtained without intravenous contrast. Angiographic images of the head were obtained using MRA technique without contrast. COMPARISON:  Carotid Doppler ultrasound 05/16/2017. FINDINGS: MRI HEAD FINDINGS Brain: No restricted diffusion to suggest acute infarction. No midline shift, mass effect, evidence of mass lesion, ventriculomegaly, extra-axial collection or acute intracranial hemorrhage. Cervicomedullary junction and pituitary are within normal limits. Scattered and patchy bilateral cerebral white matter T2 and FLAIR hyperintensity in both hemispheres. The extent is mild to moderate for age. No cortical encephalomalacia or chronic cerebral blood products identified. Mild for age T47  heterogeneity in the deep gray matter nuclei, more so the right basal ganglia. Brainstem and cerebellum appear normal for age. Vascular: Major intracranial vascular flow voids are preserved. Skull and upper cervical spine: Negative. Normal bone marrow signal. Sinuses/Orbits: Negative orbits soft tissues. Opacified left sphenoid sinus with a globular area of inspissated material centrally. Mild to moderate additional sphenoid, ethmoid and frontal sinus mucosal thickening. The maxillary sinuses are spared. Other: Visible internal auditory structures appear normal. Mastoid air cells are clear. Normal stylomastoid foramina. Scalp and face soft tissues appear negative. MRA HEAD FINDINGS Antegrade flow in the posterior circulation with patent distal  vertebral arteries, the left functionally terminates in PICA. Partially visible patent right PICA. Patent basilar artery without stenosis. Bilateral fetal type PCA origins more so the left. Normal SCA origins. Bilateral PCA branches are within normal limits. Antegrade flow in both ICA siphons. Mild siphon irregularity. No siphon stenosis. Ophthalmic and posterior communicating artery origins are normal. Patent carotid termini. Normal MCA and ACA origins. Anterior communicating artery is diminutive or absent. Visible ACA branches are within normal limits. MCA M1 segments and MCA bifurcations are patent without stenosis. Visible bilateral MCA branches are within normal limits. IMPRESSION: 1.  No acute intracranial abnormality. 2. Mild to moderate for age nonspecific cerebral white matter and deep gray matter signal changes, most commonly due to chronic small vessel disease. 3. Negative for age intracranial MRA. 4. Chronic appearing left sphenoid sinusitis with mild to moderate paranasal sinus inflammation elsewhere. Electronically Signed   By: Genevie Ann M.D.   On: 05/18/2017 12:50   Mr Brain Wo Contrast  Result Date: 05/18/2017 CLINICAL DATA:  81 year old female with vertigo. Chest pressure, shortness of breath, dizziness and nausea for several weeks. EXAM: MRI HEAD WITHOUT CONTRAST MRA HEAD WITHOUT CONTRAST TECHNIQUE: Multiplanar, multiecho pulse sequences of the brain and surrounding structures were obtained without intravenous contrast. Angiographic images of the head were obtained using MRA technique without contrast. COMPARISON:  Carotid Doppler ultrasound 05/16/2017. FINDINGS: MRI HEAD FINDINGS Brain: No restricted diffusion to suggest acute infarction. No midline shift, mass effect, evidence of mass lesion, ventriculomegaly, extra-axial collection or acute intracranial hemorrhage. Cervicomedullary junction and pituitary are within normal limits. Scattered and patchy bilateral cerebral white matter T2 and  FLAIR hyperintensity in both hemispheres. The extent is mild to moderate for age. No cortical encephalomalacia or chronic cerebral blood products identified. Mild for age T2 heterogeneity in the deep gray matter nuclei, more so the right basal ganglia. Brainstem and cerebellum appear normal for age. Vascular: Major intracranial vascular flow voids are preserved. Skull and upper cervical spine: Negative. Normal bone marrow signal. Sinuses/Orbits: Negative orbits soft tissues. Opacified left sphenoid sinus with a globular area of inspissated material centrally. Mild to moderate additional sphenoid, ethmoid and frontal sinus mucosal thickening. The maxillary sinuses are spared. Other: Visible internal auditory structures appear normal. Mastoid air cells are clear. Normal stylomastoid foramina. Scalp and face soft tissues appear negative. MRA HEAD FINDINGS Antegrade flow in the posterior circulation with patent distal vertebral arteries, the left functionally terminates in PICA. Partially visible patent right PICA. Patent basilar artery without stenosis. Bilateral fetal type PCA origins more so the left. Normal SCA origins. Bilateral PCA branches are within normal limits. Antegrade flow in both ICA siphons. Mild siphon irregularity. No siphon stenosis. Ophthalmic and posterior communicating artery origins are normal. Patent carotid termini. Normal MCA and ACA origins. Anterior communicating artery is diminutive or absent. Visible ACA branches are within normal limits. MCA M1 segments and MCA  bifurcations are patent without stenosis. Visible bilateral MCA branches are within normal limits. IMPRESSION: 1.  No acute intracranial abnormality. 2. Mild to moderate for age nonspecific cerebral white matter and deep gray matter signal changes, most commonly due to chronic small vessel disease. 3. Negative for age intracranial MRA. 4. Chronic appearing left sphenoid sinusitis with mild to moderate paranasal sinus inflammation  elsewhere. Electronically Signed   By: Genevie Ann M.D.   On: 05/18/2017 12:50    ____________________________________________   PROCEDURES Procedures  ____________________________________________   INITIAL IMPRESSION / ASSESSMENT AND PLAN / ED COURSE  Pertinent labs & imaging results that were available during my care of the patient were reviewed by me and considered in my medical decision making (see chart for details).    Clinical Course as of May 19 1507  Thu May 18, 2017  1044 Persistent vague sx. Gerd vs vertebasilar insufficiency vs unusual stroke .  Recent neg TTE and carotid doppler. Will get mri/mra brain. Antacids. ivf  [PS]  8416 Pt returned from mri. Will f/u result  [PS]    Clinical Course User Index [PS] Carrie Mew, MD     ----------------------------------------- 3:08 PM on 05/18/2017 -----------------------------------------  MRI/MRA negative. Discussed with Dr. Saralyn Pilar, no further workup at this time, no benefit to hospitalization, plan to follow up with primary care and Dr. Nehemiah Massed in clinic for outpatient stress and further monitoring of her symptoms. Continue acid suppression therapy as a trial.  Considering the patient's symptoms, medical history, and physical examination today, I have low suspicion for ACS, PE, TAD, pneumothorax, carditis, mediastinitis, pneumonia, CHF, or sepsis.  Low suspicion for stroke meningitis encephalitis intracranial hypertension glaucoma temporal arteritis. ____________________________________________   FINAL CLINICAL IMPRESSION(S) / ED DIAGNOSES  Final diagnoses:  Dizziness  Nonspecific chest pain      New Prescriptions   ALUMINUM-MAGNESIUM HYDROXIDE-SIMETHICONE (MAALOX) 606-301-60 MG/5ML SUSP    Take 30 mLs by mouth 4 (four) times daily -  before meals and at bedtime.   FAMOTIDINE (PEPCID) 20 MG TABLET    Take 1 tablet (20 mg total) by mouth 2 (two) times daily.     Portions of this note were generated  with dragon dictation software. Dictation errors may occur despite best attempts at proofreading.    Carrie Mew, MD 05/18/17 [id number]

## 2017-05-18 NOTE — ED Notes (Signed)
Patient is calling friends to see who can pick her up before taking the Diazepam.

## 2017-05-18 NOTE — ED Triage Notes (Signed)
Pt brought over by [facility] for reports of chest pressure for several weeks. Pt reports intermittent non radiating central chest pressure with accompanying SOB, dizziness and nausea for several weeks. Pt reports she took a zantac and the pressure alleviated some. Pt in no apparent distress in triage.

## 2017-05-18 NOTE — ED Notes (Signed)
Patient states she has a ride home and will accept the Valium.

## 2017-05-18 NOTE — ED Notes (Signed)
Patient denies pain and is resting comfortably.  

## 2017-05-18 NOTE — ED Notes (Signed)
NAD noted at time of D/C. Pt denies questions or concerns. Pt ambulatory to the lobby at this time.  Pt refused wheelchair to the lobby at this time.  

## 2017-06-02 DIAGNOSIS — R42 Dizziness and giddiness: Secondary | ICD-10-CM | POA: Diagnosis not present

## 2017-06-02 DIAGNOSIS — H6062 Unspecified chronic otitis externa, left ear: Secondary | ICD-10-CM | POA: Diagnosis not present

## 2017-06-13 DIAGNOSIS — Z85038 Personal history of other malignant neoplasm of large intestine: Secondary | ICD-10-CM | POA: Diagnosis not present

## 2017-07-17 DIAGNOSIS — E782 Mixed hyperlipidemia: Secondary | ICD-10-CM | POA: Diagnosis not present

## 2017-07-17 DIAGNOSIS — R5383 Other fatigue: Secondary | ICD-10-CM | POA: Diagnosis not present

## 2017-07-17 DIAGNOSIS — I10 Essential (primary) hypertension: Secondary | ICD-10-CM | POA: Diagnosis not present

## 2017-07-19 DIAGNOSIS — I10 Essential (primary) hypertension: Secondary | ICD-10-CM | POA: Diagnosis not present

## 2017-07-19 DIAGNOSIS — R5383 Other fatigue: Secondary | ICD-10-CM | POA: Diagnosis not present

## 2017-07-19 DIAGNOSIS — E782 Mixed hyperlipidemia: Secondary | ICD-10-CM | POA: Diagnosis not present

## 2017-09-04 DIAGNOSIS — H2513 Age-related nuclear cataract, bilateral: Secondary | ICD-10-CM | POA: Diagnosis not present

## 2017-10-16 DIAGNOSIS — I10 Essential (primary) hypertension: Secondary | ICD-10-CM | POA: Diagnosis not present

## 2017-10-16 DIAGNOSIS — D649 Anemia, unspecified: Secondary | ICD-10-CM | POA: Diagnosis not present

## 2017-10-16 DIAGNOSIS — E782 Mixed hyperlipidemia: Secondary | ICD-10-CM | POA: Diagnosis not present

## 2017-10-23 DIAGNOSIS — R002 Palpitations: Secondary | ICD-10-CM | POA: Diagnosis not present

## 2017-10-23 DIAGNOSIS — E782 Mixed hyperlipidemia: Secondary | ICD-10-CM | POA: Diagnosis not present

## 2017-10-23 DIAGNOSIS — I493 Ventricular premature depolarization: Secondary | ICD-10-CM | POA: Diagnosis not present

## 2017-10-23 DIAGNOSIS — Z0000 Encounter for general adult medical examination without abnormal findings: Secondary | ICD-10-CM | POA: Diagnosis not present

## 2017-10-23 DIAGNOSIS — E538 Deficiency of other specified B group vitamins: Secondary | ICD-10-CM | POA: Diagnosis not present

## 2017-10-23 DIAGNOSIS — D649 Anemia, unspecified: Secondary | ICD-10-CM | POA: Diagnosis not present

## 2018-02-20 DIAGNOSIS — D649 Anemia, unspecified: Secondary | ICD-10-CM | POA: Diagnosis not present

## 2018-02-20 DIAGNOSIS — Z0000 Encounter for general adult medical examination without abnormal findings: Secondary | ICD-10-CM | POA: Diagnosis not present

## 2018-02-20 DIAGNOSIS — I493 Ventricular premature depolarization: Secondary | ICD-10-CM | POA: Diagnosis not present

## 2018-02-20 DIAGNOSIS — R002 Palpitations: Secondary | ICD-10-CM | POA: Diagnosis not present

## 2018-02-20 DIAGNOSIS — E782 Mixed hyperlipidemia: Secondary | ICD-10-CM | POA: Diagnosis not present

## 2018-02-26 DIAGNOSIS — I6523 Occlusion and stenosis of bilateral carotid arteries: Secondary | ICD-10-CM | POA: Diagnosis not present

## 2018-02-26 DIAGNOSIS — Z23 Encounter for immunization: Secondary | ICD-10-CM | POA: Diagnosis not present

## 2018-02-26 DIAGNOSIS — I10 Essential (primary) hypertension: Secondary | ICD-10-CM | POA: Diagnosis not present

## 2018-02-26 DIAGNOSIS — D649 Anemia, unspecified: Secondary | ICD-10-CM | POA: Diagnosis not present

## 2018-02-26 DIAGNOSIS — R2689 Other abnormalities of gait and mobility: Secondary | ICD-10-CM | POA: Diagnosis not present

## 2018-02-26 DIAGNOSIS — Z85038 Personal history of other malignant neoplasm of large intestine: Secondary | ICD-10-CM | POA: Diagnosis not present

## 2018-02-26 DIAGNOSIS — R42 Dizziness and giddiness: Secondary | ICD-10-CM | POA: Diagnosis not present

## 2018-02-26 DIAGNOSIS — E782 Mixed hyperlipidemia: Secondary | ICD-10-CM | POA: Diagnosis not present

## 2018-02-26 DIAGNOSIS — Z0000 Encounter for general adult medical examination without abnormal findings: Secondary | ICD-10-CM | POA: Diagnosis not present

## 2018-02-26 DIAGNOSIS — K5900 Constipation, unspecified: Secondary | ICD-10-CM | POA: Diagnosis not present

## 2018-02-27 ENCOUNTER — Other Ambulatory Visit: Payer: Self-pay | Admitting: Internal Medicine

## 2018-02-27 DIAGNOSIS — Z1231 Encounter for screening mammogram for malignant neoplasm of breast: Secondary | ICD-10-CM

## 2018-03-06 DIAGNOSIS — Z85828 Personal history of other malignant neoplasm of skin: Secondary | ICD-10-CM | POA: Diagnosis not present

## 2018-03-06 DIAGNOSIS — C44612 Basal cell carcinoma of skin of right upper limb, including shoulder: Secondary | ICD-10-CM | POA: Diagnosis not present

## 2018-03-06 DIAGNOSIS — L821 Other seborrheic keratosis: Secondary | ICD-10-CM | POA: Diagnosis not present

## 2018-03-06 DIAGNOSIS — D485 Neoplasm of uncertain behavior of skin: Secondary | ICD-10-CM | POA: Diagnosis not present

## 2018-03-06 DIAGNOSIS — Z08 Encounter for follow-up examination after completed treatment for malignant neoplasm: Secondary | ICD-10-CM | POA: Diagnosis not present

## 2018-03-06 DIAGNOSIS — L570 Actinic keratosis: Secondary | ICD-10-CM | POA: Diagnosis not present

## 2018-03-06 DIAGNOSIS — L718 Other rosacea: Secondary | ICD-10-CM | POA: Diagnosis not present

## 2018-03-06 DIAGNOSIS — X32XXXA Exposure to sunlight, initial encounter: Secondary | ICD-10-CM | POA: Diagnosis not present

## 2018-03-19 ENCOUNTER — Ambulatory Visit
Admission: RE | Admit: 2018-03-19 | Discharge: 2018-03-19 | Disposition: A | Discharge status: Home / Self Care | Payer: Medicare HMO | Source: Ambulatory Visit | Attending: Internal Medicine | Admitting: Internal Medicine

## 2018-03-19 DIAGNOSIS — Z1231 Encounter for screening mammogram for malignant neoplasm of breast: Secondary | ICD-10-CM | POA: Diagnosis not present

## 2018-03-19 HISTORY — DX: Unspecified malignant neoplasm of skin, unspecified: C44.90

## 2018-05-04 DIAGNOSIS — C44612 Basal cell carcinoma of skin of right upper limb, including shoulder: Secondary | ICD-10-CM | POA: Diagnosis not present

## 2018-10-18 DIAGNOSIS — I10 Essential (primary) hypertension: Secondary | ICD-10-CM | POA: Diagnosis not present

## 2018-10-18 DIAGNOSIS — K5900 Constipation, unspecified: Secondary | ICD-10-CM | POA: Diagnosis not present

## 2018-10-18 DIAGNOSIS — R42 Dizziness and giddiness: Secondary | ICD-10-CM | POA: Diagnosis not present

## 2018-10-18 DIAGNOSIS — R2689 Other abnormalities of gait and mobility: Secondary | ICD-10-CM | POA: Diagnosis not present

## 2018-10-18 DIAGNOSIS — Z85038 Personal history of other malignant neoplasm of large intestine: Secondary | ICD-10-CM | POA: Diagnosis not present

## 2018-10-18 DIAGNOSIS — E782 Mixed hyperlipidemia: Secondary | ICD-10-CM | POA: Diagnosis not present

## 2018-10-18 DIAGNOSIS — D649 Anemia, unspecified: Secondary | ICD-10-CM | POA: Diagnosis not present

## 2018-10-18 DIAGNOSIS — I6523 Occlusion and stenosis of bilateral carotid arteries: Secondary | ICD-10-CM | POA: Diagnosis not present

## 2018-10-25 DIAGNOSIS — I10 Essential (primary) hypertension: Secondary | ICD-10-CM | POA: Diagnosis not present

## 2018-10-25 DIAGNOSIS — R5381 Other malaise: Secondary | ICD-10-CM | POA: Diagnosis not present

## 2018-10-25 DIAGNOSIS — Z0000 Encounter for general adult medical examination without abnormal findings: Secondary | ICD-10-CM | POA: Diagnosis not present

## 2018-10-25 DIAGNOSIS — E119 Type 2 diabetes mellitus without complications: Secondary | ICD-10-CM | POA: Diagnosis not present

## 2018-10-25 DIAGNOSIS — E538 Deficiency of other specified B group vitamins: Secondary | ICD-10-CM | POA: Diagnosis not present

## 2018-10-25 DIAGNOSIS — R5383 Other fatigue: Secondary | ICD-10-CM | POA: Diagnosis not present

## 2019-02-01 DIAGNOSIS — H2513 Age-related nuclear cataract, bilateral: Secondary | ICD-10-CM | POA: Diagnosis not present

## 2019-02-19 DIAGNOSIS — L239 Allergic contact dermatitis, unspecified cause: Secondary | ICD-10-CM | POA: Diagnosis not present

## 2019-04-24 DIAGNOSIS — R5381 Other malaise: Secondary | ICD-10-CM | POA: Diagnosis not present

## 2019-04-24 DIAGNOSIS — E538 Deficiency of other specified B group vitamins: Secondary | ICD-10-CM | POA: Diagnosis not present

## 2019-04-24 DIAGNOSIS — I10 Essential (primary) hypertension: Secondary | ICD-10-CM | POA: Diagnosis not present

## 2019-04-24 DIAGNOSIS — Z0000 Encounter for general adult medical examination without abnormal findings: Secondary | ICD-10-CM | POA: Diagnosis not present

## 2019-04-24 DIAGNOSIS — R5383 Other fatigue: Secondary | ICD-10-CM | POA: Diagnosis not present

## 2019-04-30 DIAGNOSIS — M25562 Pain in left knee: Secondary | ICD-10-CM | POA: Diagnosis not present

## 2019-04-30 DIAGNOSIS — F439 Reaction to severe stress, unspecified: Secondary | ICD-10-CM | POA: Diagnosis not present

## 2019-04-30 DIAGNOSIS — K648 Other hemorrhoids: Secondary | ICD-10-CM | POA: Diagnosis not present

## 2019-04-30 DIAGNOSIS — E538 Deficiency of other specified B group vitamins: Secondary | ICD-10-CM | POA: Diagnosis not present

## 2019-04-30 DIAGNOSIS — R5383 Other fatigue: Secondary | ICD-10-CM | POA: Diagnosis not present

## 2019-04-30 DIAGNOSIS — E785 Hyperlipidemia, unspecified: Secondary | ICD-10-CM | POA: Diagnosis not present

## 2019-04-30 DIAGNOSIS — I10 Essential (primary) hypertension: Secondary | ICD-10-CM | POA: Diagnosis not present

## 2019-04-30 DIAGNOSIS — K219 Gastro-esophageal reflux disease without esophagitis: Secondary | ICD-10-CM | POA: Diagnosis not present

## 2019-04-30 DIAGNOSIS — D649 Anemia, unspecified: Secondary | ICD-10-CM | POA: Diagnosis not present

## 2019-10-22 DIAGNOSIS — R42 Dizziness and giddiness: Secondary | ICD-10-CM | POA: Diagnosis not present

## 2019-10-22 DIAGNOSIS — I10 Essential (primary) hypertension: Secondary | ICD-10-CM | POA: Diagnosis not present

## 2019-10-22 DIAGNOSIS — R5381 Other malaise: Secondary | ICD-10-CM | POA: Diagnosis not present

## 2019-10-22 DIAGNOSIS — Z9181 History of falling: Secondary | ICD-10-CM | POA: Diagnosis not present

## 2019-10-22 DIAGNOSIS — R5383 Other fatigue: Secondary | ICD-10-CM | POA: Diagnosis not present

## 2019-10-22 DIAGNOSIS — K219 Gastro-esophageal reflux disease without esophagitis: Secondary | ICD-10-CM | POA: Diagnosis not present

## 2019-10-22 DIAGNOSIS — E782 Mixed hyperlipidemia: Secondary | ICD-10-CM | POA: Diagnosis not present

## 2019-10-29 DIAGNOSIS — M1712 Unilateral primary osteoarthritis, left knee: Secondary | ICD-10-CM | POA: Diagnosis not present

## 2019-10-29 DIAGNOSIS — Z0000 Encounter for general adult medical examination without abnormal findings: Secondary | ICD-10-CM | POA: Diagnosis not present

## 2019-10-29 DIAGNOSIS — G4700 Insomnia, unspecified: Secondary | ICD-10-CM | POA: Diagnosis not present

## 2019-10-29 DIAGNOSIS — Z87891 Personal history of nicotine dependence: Secondary | ICD-10-CM | POA: Diagnosis not present

## 2019-10-29 DIAGNOSIS — K219 Gastro-esophageal reflux disease without esophagitis: Secondary | ICD-10-CM | POA: Diagnosis not present

## 2019-10-29 DIAGNOSIS — I10 Essential (primary) hypertension: Secondary | ICD-10-CM | POA: Diagnosis not present

## 2019-10-29 DIAGNOSIS — D649 Anemia, unspecified: Secondary | ICD-10-CM | POA: Diagnosis not present

## 2019-10-29 DIAGNOSIS — R42 Dizziness and giddiness: Secondary | ICD-10-CM | POA: Diagnosis not present

## 2019-10-29 DIAGNOSIS — Z79899 Other long term (current) drug therapy: Secondary | ICD-10-CM | POA: Diagnosis not present

## 2019-11-12 DIAGNOSIS — L570 Actinic keratosis: Secondary | ICD-10-CM | POA: Diagnosis not present

## 2019-11-12 DIAGNOSIS — D2272 Melanocytic nevi of left lower limb, including hip: Secondary | ICD-10-CM | POA: Diagnosis not present

## 2019-11-12 DIAGNOSIS — D2261 Melanocytic nevi of right upper limb, including shoulder: Secondary | ICD-10-CM | POA: Diagnosis not present

## 2019-11-12 DIAGNOSIS — G8929 Other chronic pain: Secondary | ICD-10-CM | POA: Diagnosis not present

## 2019-11-12 DIAGNOSIS — M25562 Pain in left knee: Secondary | ICD-10-CM | POA: Diagnosis not present

## 2019-11-12 DIAGNOSIS — Z85828 Personal history of other malignant neoplasm of skin: Secondary | ICD-10-CM | POA: Diagnosis not present

## 2019-11-12 DIAGNOSIS — D225 Melanocytic nevi of trunk: Secondary | ICD-10-CM | POA: Diagnosis not present

## 2019-11-12 DIAGNOSIS — X32XXXA Exposure to sunlight, initial encounter: Secondary | ICD-10-CM | POA: Diagnosis not present

## 2019-11-12 DIAGNOSIS — R2689 Other abnormalities of gait and mobility: Secondary | ICD-10-CM | POA: Diagnosis not present

## 2019-11-12 DIAGNOSIS — D2262 Melanocytic nevi of left upper limb, including shoulder: Secondary | ICD-10-CM | POA: Diagnosis not present

## 2019-11-12 DIAGNOSIS — M1712 Unilateral primary osteoarthritis, left knee: Secondary | ICD-10-CM | POA: Diagnosis not present

## 2020-03-17 DIAGNOSIS — H2513 Age-related nuclear cataract, bilateral: Secondary | ICD-10-CM | POA: Diagnosis not present

## 2020-04-23 DIAGNOSIS — Z0000 Encounter for general adult medical examination without abnormal findings: Secondary | ICD-10-CM | POA: Diagnosis not present

## 2020-04-23 DIAGNOSIS — M1712 Unilateral primary osteoarthritis, left knee: Secondary | ICD-10-CM | POA: Diagnosis not present

## 2020-04-23 DIAGNOSIS — D649 Anemia, unspecified: Secondary | ICD-10-CM | POA: Diagnosis not present

## 2020-04-23 DIAGNOSIS — I10 Essential (primary) hypertension: Secondary | ICD-10-CM | POA: Diagnosis not present

## 2020-04-23 DIAGNOSIS — R42 Dizziness and giddiness: Secondary | ICD-10-CM | POA: Diagnosis not present

## 2020-05-04 DIAGNOSIS — W010XXA Fall on same level from slipping, tripping and stumbling without subsequent striking against object, initial encounter: Secondary | ICD-10-CM | POA: Diagnosis not present

## 2020-05-04 DIAGNOSIS — M25522 Pain in left elbow: Secondary | ICD-10-CM | POA: Diagnosis not present

## 2020-05-04 DIAGNOSIS — L03114 Cellulitis of left upper limb: Secondary | ICD-10-CM | POA: Diagnosis not present

## 2020-05-04 DIAGNOSIS — I10 Essential (primary) hypertension: Secondary | ICD-10-CM | POA: Diagnosis not present

## 2020-05-04 DIAGNOSIS — D649 Anemia, unspecified: Secondary | ICD-10-CM | POA: Diagnosis not present

## 2020-05-04 DIAGNOSIS — Y92009 Unspecified place in unspecified non-institutional (private) residence as the place of occurrence of the external cause: Secondary | ICD-10-CM | POA: Diagnosis not present

## 2020-05-04 DIAGNOSIS — Z043 Encounter for examination and observation following other accident: Secondary | ICD-10-CM | POA: Diagnosis not present

## 2020-05-04 DIAGNOSIS — E782 Mixed hyperlipidemia: Secondary | ICD-10-CM | POA: Diagnosis not present

## 2020-09-24 DIAGNOSIS — Z0000 Encounter for general adult medical examination without abnormal findings: Secondary | ICD-10-CM | POA: Diagnosis not present

## 2020-09-24 DIAGNOSIS — Z79899 Other long term (current) drug therapy: Secondary | ICD-10-CM | POA: Diagnosis not present

## 2020-09-24 DIAGNOSIS — Z1231 Encounter for screening mammogram for malignant neoplasm of breast: Secondary | ICD-10-CM | POA: Diagnosis not present

## 2020-09-24 DIAGNOSIS — I10 Essential (primary) hypertension: Secondary | ICD-10-CM | POA: Diagnosis not present

## 2020-11-25 DIAGNOSIS — I6523 Occlusion and stenosis of bilateral carotid arteries: Secondary | ICD-10-CM | POA: Diagnosis not present

## 2020-11-25 DIAGNOSIS — R42 Dizziness and giddiness: Secondary | ICD-10-CM | POA: Diagnosis not present

## 2020-11-25 DIAGNOSIS — I10 Essential (primary) hypertension: Secondary | ICD-10-CM | POA: Diagnosis not present

## 2021-01-26 DIAGNOSIS — D2261 Melanocytic nevi of right upper limb, including shoulder: Secondary | ICD-10-CM | POA: Diagnosis not present

## 2021-01-26 DIAGNOSIS — D225 Melanocytic nevi of trunk: Secondary | ICD-10-CM | POA: Diagnosis not present

## 2021-01-26 DIAGNOSIS — L821 Other seborrheic keratosis: Secondary | ICD-10-CM | POA: Diagnosis not present

## 2021-01-26 DIAGNOSIS — L718 Other rosacea: Secondary | ICD-10-CM | POA: Diagnosis not present

## 2021-01-26 DIAGNOSIS — Z85828 Personal history of other malignant neoplasm of skin: Secondary | ICD-10-CM | POA: Diagnosis not present

## 2021-01-26 DIAGNOSIS — D2262 Melanocytic nevi of left upper limb, including shoulder: Secondary | ICD-10-CM | POA: Diagnosis not present

## 2021-01-26 DIAGNOSIS — X32XXXA Exposure to sunlight, initial encounter: Secondary | ICD-10-CM | POA: Diagnosis not present

## 2021-01-26 DIAGNOSIS — L570 Actinic keratosis: Secondary | ICD-10-CM | POA: Diagnosis not present

## 2021-02-01 DIAGNOSIS — R42 Dizziness and giddiness: Secondary | ICD-10-CM | POA: Diagnosis not present

## 2021-02-01 DIAGNOSIS — I10 Essential (primary) hypertension: Secondary | ICD-10-CM | POA: Diagnosis not present

## 2021-02-01 DIAGNOSIS — R8290 Unspecified abnormal findings in urine: Secondary | ICD-10-CM | POA: Diagnosis not present

## 2021-02-01 DIAGNOSIS — E538 Deficiency of other specified B group vitamins: Secondary | ICD-10-CM | POA: Diagnosis not present

## 2021-02-08 DIAGNOSIS — Z0000 Encounter for general adult medical examination without abnormal findings: Secondary | ICD-10-CM | POA: Diagnosis not present

## 2021-02-08 DIAGNOSIS — Z79899 Other long term (current) drug therapy: Secondary | ICD-10-CM | POA: Diagnosis not present

## 2021-02-08 DIAGNOSIS — M1712 Unilateral primary osteoarthritis, left knee: Secondary | ICD-10-CM | POA: Diagnosis not present

## 2021-02-08 DIAGNOSIS — I10 Essential (primary) hypertension: Secondary | ICD-10-CM | POA: Diagnosis not present

## 2021-02-15 DIAGNOSIS — G8929 Other chronic pain: Secondary | ICD-10-CM | POA: Diagnosis not present

## 2021-02-15 DIAGNOSIS — M1712 Unilateral primary osteoarthritis, left knee: Secondary | ICD-10-CM | POA: Diagnosis not present

## 2021-02-15 DIAGNOSIS — M25462 Effusion, left knee: Secondary | ICD-10-CM | POA: Diagnosis not present

## 2021-02-15 DIAGNOSIS — M25562 Pain in left knee: Secondary | ICD-10-CM | POA: Diagnosis not present

## 2021-03-18 DIAGNOSIS — Z0100 Encounter for examination of eyes and vision without abnormal findings: Secondary | ICD-10-CM | POA: Diagnosis not present

## 2021-03-18 DIAGNOSIS — H2513 Age-related nuclear cataract, bilateral: Secondary | ICD-10-CM | POA: Diagnosis not present

## 2021-09-16 DIAGNOSIS — I10 Essential (primary) hypertension: Secondary | ICD-10-CM | POA: Diagnosis not present

## 2021-09-16 DIAGNOSIS — Z0000 Encounter for general adult medical examination without abnormal findings: Secondary | ICD-10-CM | POA: Diagnosis not present

## 2021-09-16 DIAGNOSIS — K5900 Constipation, unspecified: Secondary | ICD-10-CM | POA: Diagnosis not present

## 2021-09-16 DIAGNOSIS — M1712 Unilateral primary osteoarthritis, left knee: Secondary | ICD-10-CM | POA: Diagnosis not present

## 2021-09-16 DIAGNOSIS — R8290 Unspecified abnormal findings in urine: Secondary | ICD-10-CM | POA: Diagnosis not present

## 2021-09-16 DIAGNOSIS — E538 Deficiency of other specified B group vitamins: Secondary | ICD-10-CM | POA: Diagnosis not present

## 2021-09-23 DIAGNOSIS — B9620 Unspecified Escherichia coli [E. coli] as the cause of diseases classified elsewhere: Secondary | ICD-10-CM | POA: Diagnosis not present

## 2021-09-23 DIAGNOSIS — D649 Anemia, unspecified: Secondary | ICD-10-CM | POA: Diagnosis not present

## 2021-09-23 DIAGNOSIS — E785 Hyperlipidemia, unspecified: Secondary | ICD-10-CM | POA: Diagnosis not present

## 2021-09-23 DIAGNOSIS — I10 Essential (primary) hypertension: Secondary | ICD-10-CM | POA: Diagnosis not present

## 2021-09-23 DIAGNOSIS — N390 Urinary tract infection, site not specified: Secondary | ICD-10-CM | POA: Diagnosis not present

## 2021-09-23 DIAGNOSIS — G4700 Insomnia, unspecified: Secondary | ICD-10-CM | POA: Diagnosis not present

## 2022-02-17 ENCOUNTER — Other Ambulatory Visit: Payer: Self-pay | Admitting: Internal Medicine

## 2022-02-17 DIAGNOSIS — Z1231 Encounter for screening mammogram for malignant neoplasm of breast: Secondary | ICD-10-CM

## 2022-02-25 ENCOUNTER — Emergency Department: Payer: Medicare HMO

## 2022-02-25 ENCOUNTER — Emergency Department
Admission: EM | Admit: 2022-02-25 | Discharge: 2022-02-25 | Disposition: A | Discharge status: Home / Self Care | Payer: Medicare HMO | Attending: Emergency Medicine | Admitting: Emergency Medicine

## 2022-02-25 ENCOUNTER — Other Ambulatory Visit: Payer: Self-pay

## 2022-02-25 DIAGNOSIS — S42212A Unspecified displaced fracture of surgical neck of left humerus, initial encounter for closed fracture: Secondary | ICD-10-CM | POA: Diagnosis not present

## 2022-02-25 DIAGNOSIS — I10 Essential (primary) hypertension: Secondary | ICD-10-CM | POA: Insufficient documentation

## 2022-02-25 DIAGNOSIS — W108XXA Fall (on) (from) other stairs and steps, initial encounter: Secondary | ICD-10-CM | POA: Insufficient documentation

## 2022-02-25 DIAGNOSIS — I700 Atherosclerosis of aorta: Secondary | ICD-10-CM | POA: Diagnosis not present

## 2022-02-25 DIAGNOSIS — S4992XA Unspecified injury of left shoulder and upper arm, initial encounter: Secondary | ICD-10-CM | POA: Diagnosis present

## 2022-02-25 MED ORDER — TRAMADOL HCL 50 MG PO TABS
50.0000 mg | ORAL_TABLET | Freq: Once | ORAL | Status: AC
  Administered 2022-02-25: 50 mg via ORAL
  Filled 2022-02-25: qty 1

## 2022-02-25 MED ORDER — TRAMADOL HCL 50 MG PO TABS: 50.0000 mg | ORAL_TABLET | Freq: Four times a day (QID) | ORAL | 0 refills | Status: DC | PRN

## 2022-02-25 NOTE — ED Triage Notes (Signed)
Pt states she slipped on 2 steps around 2am Monday morning and has been having left shoulder pain with significant swelling and bruising to the left arm. Denies any other injury. Pt is a/ox4 on arrival, ambulatory with a steady gait

## 2022-02-25 NOTE — ED Provider Notes (Signed)
[facility] Provider Note    Event Date/Time   First MD Initiated Contact with Patient 02/25/22 1117     (approximate)   History   Fall and Shoulder Pain   HPI  [place] Cornesha Radziewicz is a 86 y.o. female with history of hypertension and hyperlipidemia presents emergency department after a fall 4 days ago.  Patient states that she was staying at her niece's house and got up to go the bathroom and fell down 2 steps.  States she landed on the left side.  Did not hit her head.  No headache or vomiting.  Family member states no altered mental status.  She denies any other injuries at this time.  Complains only of left shoulder pain      Physical Exam   Triage Vital Signs: ED Triage Vitals  Enc Vitals Group     BP 02/25/22 1033 (!) 170/85     Pulse Rate 02/25/22 1033 87     Resp 02/25/22 1033 16     Temp 02/25/22 1033 97.8 F (36.6 C)     Temp Source 02/25/22 1033 Oral     SpO2 02/25/22 1033 93 %     Weight 02/25/22 1115 132 lb 15 oz (60.3 kg)     Height 02/25/22 1115 '5\' 1"'$  (1.549 m)     Head Circumference --      Peak Flow --      Pain Score --      Pain Loc --      Pain Edu? --      Excl. in [place]? --     Most recent vital signs: Vitals:   02/25/22 1154 02/25/22 1214  BP: (!) 168/80 (!) 160/78  Pulse: 80 78  Resp: 16 16  Temp:    SpO2: 94% 95%     General: Awake, no distress.   CV:  Good peripheral perfusion. regular rate and  rhythm Resp:  Normal effort. Abd:  No distention.   Other:  Extensive bruising and swelling noted along the left upper extremity, very tender at the left shoulder joint, decreased range of motion   ED Results / Procedures / Treatments   Labs (all labs ordered are listed, but only abnormal results are displayed) Labs Reviewed - No data to display   EKG     RADIOLOGY X-ray left shoulder, CT left shoulder    PROCEDURES:   Procedures   MEDICATIONS ORDERED IN ED: Medications  traMADol (ULTRAM)  tablet 50 mg (50 mg Oral Given 02/25/22 1136)     IMPRESSION / MDM / [place] / ED COURSE  I reviewed the triage vital signs and the nursing notes.                              Differential diagnosis includes, but is not limited to, fracture, contusion, strain  Patient's presentation is most consistent with acute complicated illness / injury requiring diagnostic workup.   X-ray of the left shoulder independently interpreted by me as a fracture at the humeral neck.  Consult to orthopedics.  Dr. Posey Pronto would like for [place] to do CT prior to discharge.  Follow-up in the office  Patient be placed in a sling   I did discuss doing a head CT at this time, pt is refusing stating she did not hit her head.  No AMS per family member.  Both agree to not doing the CT.  Strict  instructions to return if any AMS or headache  Patient was discharged home in care of her family member.  Prescription for tramadol sent to the pharmacy.  She is to follow-up with Dr. Posey Pronto next week.   FINAL CLINICAL IMPRESSION(S) / ED DIAGNOSES   Final diagnoses:  Closed displaced fracture of surgical neck of left humerus, unspecified fracture morphology, initial encounter     Rx / DC Orders   ED Discharge Orders          Ordered    traMADol (ULTRAM) 50 MG tablet  Every 6 hours PRN        02/25/22 1154             Note:  This document was prepared using Dragon voice recognition software and may include unintentional dictation errors.    Versie Starks, PA-C 02/25/22 1229    Duffy Bruce, MD 02/25/22 2234

## 2022-02-25 NOTE — ED Notes (Signed)
See triage note  Presents s/p fall on Monday . Bruising and swelling noted to left upper arm  Good pulses

## 2022-02-25 NOTE — Discharge Instructions (Signed)
Follow-up with [facility] clinic orthopedics.  Please call for appointment.  Apply ice to the left shoulder.  Return if worsening.  Return if increasing headache or altered mental status

## 2022-03-02 DIAGNOSIS — M25512 Pain in left shoulder: Secondary | ICD-10-CM | POA: Diagnosis not present

## 2022-03-29 ENCOUNTER — Ambulatory Visit
Admission: RE | Admit: 2022-03-29 | Discharge: 2022-03-29 | Disposition: A | Discharge status: Home / Self Care | Payer: Medicare HMO | Source: Ambulatory Visit | Attending: Internal Medicine | Admitting: Internal Medicine

## 2022-03-29 DIAGNOSIS — Z1231 Encounter for screening mammogram for malignant neoplasm of breast: Secondary | ICD-10-CM | POA: Diagnosis not present

## 2022-03-30 DIAGNOSIS — S42202A Unspecified fracture of upper end of left humerus, initial encounter for closed fracture: Secondary | ICD-10-CM | POA: Diagnosis not present

## 2022-03-31 DIAGNOSIS — R7309 Other abnormal glucose: Secondary | ICD-10-CM | POA: Diagnosis not present

## 2022-03-31 DIAGNOSIS — I10 Essential (primary) hypertension: Secondary | ICD-10-CM | POA: Diagnosis not present

## 2022-03-31 DIAGNOSIS — N390 Urinary tract infection, site not specified: Secondary | ICD-10-CM | POA: Diagnosis not present

## 2022-03-31 DIAGNOSIS — E782 Mixed hyperlipidemia: Secondary | ICD-10-CM | POA: Diagnosis not present

## 2022-03-31 DIAGNOSIS — G4709 Other insomnia: Secondary | ICD-10-CM | POA: Diagnosis not present

## 2022-03-31 DIAGNOSIS — D649 Anemia, unspecified: Secondary | ICD-10-CM | POA: Diagnosis not present

## 2022-03-31 DIAGNOSIS — B9620 Unspecified Escherichia coli [E. coli] as the cause of diseases classified elsewhere: Secondary | ICD-10-CM | POA: Diagnosis not present

## 2022-05-11 DIAGNOSIS — S42202A Unspecified fracture of upper end of left humerus, initial encounter for closed fracture: Secondary | ICD-10-CM | POA: Diagnosis not present

## 2022-05-12 DIAGNOSIS — M7989 Other specified soft tissue disorders: Secondary | ICD-10-CM | POA: Diagnosis not present

## 2022-05-20 DIAGNOSIS — H5213 Myopia, bilateral: Secondary | ICD-10-CM | POA: Diagnosis not present

## 2022-09-09 DIAGNOSIS — H2513 Age-related nuclear cataract, bilateral: Secondary | ICD-10-CM | POA: Diagnosis not present

## 2022-09-09 DIAGNOSIS — Z0100 Encounter for examination of eyes and vision without abnormal findings: Secondary | ICD-10-CM | POA: Diagnosis not present

## 2022-09-12 DIAGNOSIS — I10 Essential (primary) hypertension: Secondary | ICD-10-CM | POA: Diagnosis not present

## 2022-09-12 DIAGNOSIS — Z0000 Encounter for general adult medical examination without abnormal findings: Secondary | ICD-10-CM | POA: Diagnosis not present

## 2022-09-12 DIAGNOSIS — D649 Anemia, unspecified: Secondary | ICD-10-CM | POA: Diagnosis not present

## 2022-09-12 DIAGNOSIS — Z1331 Encounter for screening for depression: Secondary | ICD-10-CM | POA: Diagnosis not present

## 2022-09-12 DIAGNOSIS — E785 Hyperlipidemia, unspecified: Secondary | ICD-10-CM | POA: Diagnosis not present

## 2023-01-17 DIAGNOSIS — D649 Anemia, unspecified: Secondary | ICD-10-CM | POA: Diagnosis not present

## 2023-01-17 DIAGNOSIS — Z85038 Personal history of other malignant neoplasm of large intestine: Secondary | ICD-10-CM | POA: Diagnosis not present

## 2023-01-17 DIAGNOSIS — Z23 Encounter for immunization: Secondary | ICD-10-CM | POA: Diagnosis not present

## 2023-01-17 DIAGNOSIS — I10 Essential (primary) hypertension: Secondary | ICD-10-CM | POA: Diagnosis not present

## 2023-01-17 DIAGNOSIS — E538 Deficiency of other specified B group vitamins: Secondary | ICD-10-CM | POA: Diagnosis not present

## 2023-01-17 DIAGNOSIS — E785 Hyperlipidemia, unspecified: Secondary | ICD-10-CM | POA: Diagnosis not present

## 2023-05-18 DIAGNOSIS — R7309 Other abnormal glucose: Secondary | ICD-10-CM | POA: Diagnosis not present

## 2023-05-18 DIAGNOSIS — R8290 Unspecified abnormal findings in urine: Secondary | ICD-10-CM | POA: Diagnosis not present

## 2023-05-18 DIAGNOSIS — Z85038 Personal history of other malignant neoplasm of large intestine: Secondary | ICD-10-CM | POA: Diagnosis not present

## 2023-05-18 DIAGNOSIS — D649 Anemia, unspecified: Secondary | ICD-10-CM | POA: Diagnosis not present

## 2023-05-18 DIAGNOSIS — I10 Essential (primary) hypertension: Secondary | ICD-10-CM | POA: Diagnosis not present

## 2023-05-18 DIAGNOSIS — E782 Mixed hyperlipidemia: Secondary | ICD-10-CM | POA: Diagnosis not present

## 2023-05-18 DIAGNOSIS — E538 Deficiency of other specified B group vitamins: Secondary | ICD-10-CM | POA: Diagnosis not present

## 2023-05-25 DIAGNOSIS — I10 Essential (primary) hypertension: Secondary | ICD-10-CM | POA: Diagnosis not present

## 2023-05-25 DIAGNOSIS — Z23 Encounter for immunization: Secondary | ICD-10-CM | POA: Diagnosis not present

## 2023-05-25 DIAGNOSIS — R5383 Other fatigue: Secondary | ICD-10-CM | POA: Diagnosis not present

## 2023-05-25 DIAGNOSIS — R7309 Other abnormal glucose: Secondary | ICD-10-CM | POA: Diagnosis not present

## 2023-05-25 DIAGNOSIS — N390 Urinary tract infection, site not specified: Secondary | ICD-10-CM | POA: Diagnosis not present

## 2023-05-25 DIAGNOSIS — F32A Depression, unspecified: Secondary | ICD-10-CM | POA: Diagnosis not present

## 2023-05-25 DIAGNOSIS — E785 Hyperlipidemia, unspecified: Secondary | ICD-10-CM | POA: Diagnosis not present

## 2023-09-19 DIAGNOSIS — I10 Essential (primary) hypertension: Secondary | ICD-10-CM | POA: Diagnosis not present

## 2023-09-19 DIAGNOSIS — R7309 Other abnormal glucose: Secondary | ICD-10-CM | POA: Diagnosis not present

## 2023-09-19 DIAGNOSIS — N390 Urinary tract infection, site not specified: Secondary | ICD-10-CM | POA: Diagnosis not present

## 2023-09-19 DIAGNOSIS — R5383 Other fatigue: Secondary | ICD-10-CM | POA: Diagnosis not present

## 2023-09-19 DIAGNOSIS — R5381 Other malaise: Secondary | ICD-10-CM | POA: Diagnosis not present

## 2023-09-19 DIAGNOSIS — E782 Mixed hyperlipidemia: Secondary | ICD-10-CM | POA: Diagnosis not present

## 2023-09-26 DIAGNOSIS — Z1331 Encounter for screening for depression: Secondary | ICD-10-CM | POA: Diagnosis not present

## 2023-09-26 DIAGNOSIS — L989 Disorder of the skin and subcutaneous tissue, unspecified: Secondary | ICD-10-CM | POA: Diagnosis not present

## 2023-09-26 DIAGNOSIS — E785 Hyperlipidemia, unspecified: Secondary | ICD-10-CM | POA: Diagnosis not present

## 2023-09-26 DIAGNOSIS — Z0000 Encounter for general adult medical examination without abnormal findings: Secondary | ICD-10-CM | POA: Diagnosis not present

## 2023-09-26 DIAGNOSIS — F32A Depression, unspecified: Secondary | ICD-10-CM | POA: Diagnosis not present

## 2023-09-26 DIAGNOSIS — I10 Essential (primary) hypertension: Secondary | ICD-10-CM | POA: Diagnosis not present

## 2023-10-11 DIAGNOSIS — H52221 Regular astigmatism, right eye: Secondary | ICD-10-CM | POA: Diagnosis not present

## 2023-10-11 DIAGNOSIS — H2513 Age-related nuclear cataract, bilateral: Secondary | ICD-10-CM | POA: Diagnosis not present

## 2023-10-11 DIAGNOSIS — H5213 Myopia, bilateral: Secondary | ICD-10-CM | POA: Diagnosis not present

## 2023-10-20 DIAGNOSIS — L718 Other rosacea: Secondary | ICD-10-CM | POA: Diagnosis not present

## 2024-02-02 DIAGNOSIS — M1712 Unilateral primary osteoarthritis, left knee: Secondary | ICD-10-CM | POA: Diagnosis not present

## 2024-02-02 DIAGNOSIS — M222X2 Patellofemoral disorders, left knee: Secondary | ICD-10-CM | POA: Diagnosis not present

## 2024-04-01 DIAGNOSIS — F330 Major depressive disorder, recurrent, mild: Secondary | ICD-10-CM | POA: Diagnosis not present

## 2024-04-01 DIAGNOSIS — L989 Disorder of the skin and subcutaneous tissue, unspecified: Secondary | ICD-10-CM | POA: Diagnosis not present

## 2024-04-01 DIAGNOSIS — E782 Mixed hyperlipidemia: Secondary | ICD-10-CM | POA: Diagnosis not present

## 2024-04-01 DIAGNOSIS — I10 Essential (primary) hypertension: Secondary | ICD-10-CM | POA: Diagnosis not present

## 2024-04-01 DIAGNOSIS — R7309 Other abnormal glucose: Secondary | ICD-10-CM | POA: Diagnosis not present

## 2024-04-05 DIAGNOSIS — Z0000 Encounter for general adult medical examination without abnormal findings: Secondary | ICD-10-CM | POA: Diagnosis not present

## 2024-04-05 DIAGNOSIS — I10 Essential (primary) hypertension: Secondary | ICD-10-CM | POA: Diagnosis not present

## 2024-04-05 DIAGNOSIS — Z1231 Encounter for screening mammogram for malignant neoplasm of breast: Secondary | ICD-10-CM | POA: Diagnosis not present

## 2024-04-05 DIAGNOSIS — Z1331 Encounter for screening for depression: Secondary | ICD-10-CM | POA: Diagnosis not present

## 2024-04-05 DIAGNOSIS — F32A Depression, unspecified: Secondary | ICD-10-CM | POA: Diagnosis not present

## 2024-04-08 ENCOUNTER — Encounter: Payer: Self-pay | Admitting: Internal Medicine

## 2024-04-08 DIAGNOSIS — Z1231 Encounter for screening mammogram for malignant neoplasm of breast: Secondary | ICD-10-CM

## 2024-04-09 ENCOUNTER — Other Ambulatory Visit: Payer: Self-pay | Admitting: Internal Medicine

## 2024-04-09 DIAGNOSIS — N6320 Unspecified lump in the left breast, unspecified quadrant: Secondary | ICD-10-CM

## 2024-04-12 ENCOUNTER — Other Ambulatory Visit: Payer: Self-pay | Admitting: Internal Medicine

## 2024-04-12 DIAGNOSIS — N63 Unspecified lump in unspecified breast: Secondary | ICD-10-CM

## 2024-04-18 ENCOUNTER — Ambulatory Visit
Admission: RE | Admit: 2024-04-18 | Discharge: 2024-04-18 | Disposition: A | Discharge status: Home / Self Care | Source: Ambulatory Visit | Attending: Internal Medicine | Admitting: Internal Medicine

## 2024-04-18 DIAGNOSIS — N63 Unspecified lump in unspecified breast: Secondary | ICD-10-CM | POA: Diagnosis present

## 2024-04-18 DIAGNOSIS — R590 Localized enlarged lymph nodes: Secondary | ICD-10-CM | POA: Insufficient documentation

## 2024-04-18 DIAGNOSIS — N6312 Unspecified lump in the right breast, upper inner quadrant: Secondary | ICD-10-CM | POA: Diagnosis not present

## 2024-04-18 DIAGNOSIS — N6311 Unspecified lump in the right breast, upper outer quadrant: Secondary | ICD-10-CM | POA: Diagnosis not present

## 2024-04-18 DIAGNOSIS — N6315 Unspecified lump in the right breast, overlapping quadrants: Secondary | ICD-10-CM | POA: Insufficient documentation

## 2024-04-18 DIAGNOSIS — N649 Disorder of breast, unspecified: Secondary | ICD-10-CM | POA: Diagnosis not present

## 2024-04-24 ENCOUNTER — Other Ambulatory Visit: Payer: Self-pay | Admitting: Internal Medicine

## 2024-04-24 DIAGNOSIS — R928 Other abnormal and inconclusive findings on diagnostic imaging of breast: Secondary | ICD-10-CM

## 2024-05-01 ENCOUNTER — Inpatient Hospital Stay
Admission: RE | Admit: 2024-05-01 | Discharge: 2024-05-01 | Disposition: A | Discharge status: Home / Self Care | Source: Ambulatory Visit | Attending: Internal Medicine | Admitting: Internal Medicine

## 2024-05-01 ENCOUNTER — Ambulatory Visit
Admission: RE | Admit: 2024-05-01 | Discharge: 2024-05-01 | Disposition: A | Discharge status: Home / Self Care | Source: Ambulatory Visit | Attending: Internal Medicine | Admitting: Internal Medicine

## 2024-05-01 DIAGNOSIS — R928 Other abnormal and inconclusive findings on diagnostic imaging of breast: Secondary | ICD-10-CM | POA: Diagnosis not present

## 2024-05-01 DIAGNOSIS — C773 Secondary and unspecified malignant neoplasm of axilla and upper limb lymph nodes: Secondary | ICD-10-CM | POA: Insufficient documentation

## 2024-05-01 DIAGNOSIS — N6313 Unspecified lump in the right breast, lower outer quadrant: Secondary | ICD-10-CM | POA: Diagnosis not present

## 2024-05-01 DIAGNOSIS — R599 Enlarged lymph nodes, unspecified: Secondary | ICD-10-CM | POA: Diagnosis not present

## 2024-05-01 DIAGNOSIS — C50211 Malignant neoplasm of upper-inner quadrant of right female breast: Secondary | ICD-10-CM | POA: Insufficient documentation

## 2024-05-01 DIAGNOSIS — C50511 Malignant neoplasm of lower-outer quadrant of right female breast: Secondary | ICD-10-CM | POA: Insufficient documentation

## 2024-05-01 DIAGNOSIS — N6312 Unspecified lump in the right breast, upper inner quadrant: Secondary | ICD-10-CM | POA: Diagnosis not present

## 2024-05-01 HISTORY — PX: BREAST BIOPSY: SHX20

## 2024-05-01 MED ORDER — LIDOCAINE 1 % OPTIME INJ - NO CHARGE
2.0000 mL | Freq: Once | INTRAMUSCULAR | Status: AC
Start: 2024-05-01 — End: 2024-05-01
  Administered 2024-05-01: 2 mL
  Filled 2024-05-01: qty 2

## 2024-05-01 MED ORDER — LIDOCAINE 1 % OPTIME INJ - NO CHARGE
2.0000 mL | Freq: Once | INTRAMUSCULAR | Status: AC
  Administered 2024-05-01: 2 mL
  Filled 2024-05-01: qty 2

## 2024-05-01 MED ORDER — LIDOCAINE-EPINEPHRINE 1 %-1:100000 IJ SOLN
10.0000 mL | Freq: Once | INTRAMUSCULAR | Status: AC
  Administered 2024-05-01: 10 mL
  Filled 2024-05-01: qty 10

## 2024-05-01 MED ORDER — LIDOCAINE-EPINEPHRINE 1 %-1:100000 IJ SOLN
5.0000 mL | Freq: Once | INTRAMUSCULAR | Status: AC
  Administered 2024-05-01: 5 mL via INTRADERMAL

## 2024-05-01 MED ORDER — LIDOCAINE HCL 1 % IJ SOLN
2.0000 mL | Freq: Once | INTRAMUSCULAR | Status: AC
Start: 2024-05-01 — End: 2024-05-01
  Administered 2024-05-01: 2 mL via INTRADERMAL

## 2024-05-01 MED ORDER — LIDOCAINE-EPINEPHRINE 1 %-1:100000 IJ SOLN
10.0000 mL | Freq: Once | INTRAMUSCULAR | Status: AC
  Administered 2024-05-01: 10 mL

## 2024-05-02 LAB — SURGICAL PATHOLOGY

## 2024-05-03 ENCOUNTER — Encounter: Payer: Self-pay | Admitting: *Deleted

## 2024-05-03 DIAGNOSIS — C50919 Malignant neoplasm of unspecified site of unspecified female breast: Secondary | ICD-10-CM

## 2024-05-03 NOTE — Progress Notes (Signed)
 Received referral for newly diagnosed breast cancer from [facility].  Navigation initiated.  She will see Dr. Cesar 9/16 at 2:00 and Dr. Melanee on Wednesday 9/17 at 2:30.

## 2024-05-07 DIAGNOSIS — Z170 Estrogen receptor positive status [ER+]: Secondary | ICD-10-CM | POA: Diagnosis not present

## 2024-05-07 DIAGNOSIS — C50811 Malignant neoplasm of overlapping sites of right female breast: Secondary | ICD-10-CM | POA: Diagnosis not present

## 2024-05-08 ENCOUNTER — Inpatient Hospital Stay: Attending: Oncology | Admitting: Oncology

## 2024-05-08 ENCOUNTER — Encounter: Payer: Self-pay | Admitting: Oncology

## 2024-05-08 ENCOUNTER — Inpatient Hospital Stay

## 2024-05-08 VITALS — BP 118/70 | HR 72 | Temp 97.0°F | Resp 16 | Ht 61.0 in | Wt 120.0 lb

## 2024-05-08 DIAGNOSIS — Z1722 Progesterone receptor negative status: Secondary | ICD-10-CM | POA: Insufficient documentation

## 2024-05-08 DIAGNOSIS — Z170 Estrogen receptor positive status [ER+]: Secondary | ICD-10-CM | POA: Diagnosis not present

## 2024-05-08 DIAGNOSIS — Z85828 Personal history of other malignant neoplasm of skin: Secondary | ICD-10-CM | POA: Insufficient documentation

## 2024-05-08 DIAGNOSIS — C773 Secondary and unspecified malignant neoplasm of axilla and upper limb lymph nodes: Secondary | ICD-10-CM | POA: Insufficient documentation

## 2024-05-08 DIAGNOSIS — Z85038 Personal history of other malignant neoplasm of large intestine: Secondary | ICD-10-CM | POA: Insufficient documentation

## 2024-05-08 DIAGNOSIS — C50811 Malignant neoplasm of overlapping sites of right female breast: Secondary | ICD-10-CM | POA: Insufficient documentation

## 2024-05-08 DIAGNOSIS — I10 Essential (primary) hypertension: Secondary | ICD-10-CM | POA: Diagnosis not present

## 2024-05-08 DIAGNOSIS — Z7189 Other specified counseling: Secondary | ICD-10-CM

## 2024-05-08 DIAGNOSIS — C50919 Malignant neoplasm of unspecified site of unspecified female breast: Secondary | ICD-10-CM

## 2024-05-08 NOTE — Progress Notes (Unsigned)
 Spoke with Client Services regarding Path HER2 FISH pathology results.  Per Client Services it is a 4 page report not quite signed out yet.  Maybe by tomorrow.  Dr. Frutoso did part of it and Dr. Macie is doing the other part.  It appears Dr. Macie hasn't signed out the HER2 portion.

## 2024-05-09 ENCOUNTER — Telehealth: Payer: Self-pay

## 2024-05-09 DIAGNOSIS — Z170 Estrogen receptor positive status [ER+]: Secondary | ICD-10-CM | POA: Insufficient documentation

## 2024-05-09 NOTE — Telephone Encounter (Signed)
 Regarding PET CT per [facility] appointment available Monday 9/22 0830 I scheduled her. If she thinks its way too early 1330 is still open she will have to go 6 hrs NPO.  Outbound call; spoke to niece Almarie who agreed to 9/22 at 8:30am. Also indicated she will be getting a call the day before

## 2024-05-09 NOTE — Telephone Encounter (Signed)
 Spoke to representative who states the report is complete and will fax it over now.

## 2024-05-09 NOTE — Telephone Encounter (Signed)
 New patient seen yesterday 05/08/24.  Spoke with Client Services regarding Path HER2 FISH pathology results. Per Client Services it is a 4 page report not quite signed out yet. Maybe by tomorrow. Dr. Frutoso did part of it and Dr. Macie is doing the other part. It appears Dr. Macie hasn't signed out the HER2 portion.

## 2024-05-09 NOTE — Progress Notes (Signed)
 Hematology/Oncology Consult note [facility] [phone] [phone]  Patient Care Team: Sadie Manna, MD as PCP - General (Internal Medicine) Georgina Shasta POUR, RN as Oncology Nurse Navigator   Name of the patient: Martha  Munoz  [id number]  November 14, 1935    Reason for referral-new diagnosis of breast cancer   Referring physician-Dr. Sadie  Date of visit: 05/09/24   History of presenting illness-patient is a 88 year old female who found right breast mass over 6 months ago.  She underwent a diagnostic mammogram in August 2025 after she mentioned it to her primary care doctor.  Mammogram and ultrasound showed multiple contiguous appearing masses throughout the right upper outer and lower outer quadrants extending into the posterior retroareolar.  Spanning at least 5.6 cm.  Representative conglomeration of masses documented at 7:00 4 cm from the nipple which measures 4.6 x 2.7 x 4.9 cm.This extends and it appears contiguous with the retroareolar mass at 9 o'clock. An additional representative cluster of masses is documented at 10:30 8 cm from the nipple which measures 3.3 x 1.4 by 1.7 cm.  Single mildly enlarged right axillary lymph node  Patient underwent breast biopsy from right upper quadrant and right lower quadrant as well as axillary lymph node biopsy.  This was consistent with invasive mammary carcinoma grade 2.  Immunohistochemical stain shows loss of E-cadherin expression consistent with lobular phenotype.  ER 100% positive PR negative Ki-67 5%.  Tumor cells were equivocal for HER2 and HER2 FISH testing was pending at the time of my visit  Patient is here with her niece today.She is independent of her ADLs.  No known family history of breast cancer.  ECOG PS- 2  Pain scale- 0   Review of systems- Review of Systems  Constitutional:  Negative for chills, fever, malaise/fatigue and weight loss.  HENT:  Negative for congestion, ear  discharge and nosebleeds.   Eyes:  Negative for blurred vision.  Respiratory:  Negative for cough, hemoptysis, sputum production, shortness of breath and wheezing.   Cardiovascular:  Negative for chest pain, palpitations, orthopnea and claudication.  Gastrointestinal:  Negative for abdominal pain, blood in stool, constipation, diarrhea, heartburn, melena, nausea and vomiting.  Genitourinary:  Negative for dysuria, flank pain, frequency, hematuria and urgency.  Musculoskeletal:  Negative for back pain, joint pain and myalgias.  Skin:  Negative for rash.  Neurological:  Negative for dizziness, tingling, focal weakness, seizures, weakness and headaches.  Endo/Heme/Allergies:  Does not bruise/bleed easily.  Psychiatric/Behavioral:  Negative for depression and suicidal ideas. The patient does not have insomnia.     No Known Allergies  There are no active problems to display for this patient.    Past Medical History:  Diagnosis Date   Colon cancer (HCC)    Hyperlipidemia    Hypertension    Skin cancer      Past Surgical History:  Procedure Laterality Date   BREAST BIOPSY Right 90s   benign   BREAST BIOPSY Right 05/01/2024   US  RT BREAST BX W LOC DEV EA ADD LESION IMG BX SPEC US  GUIDE 05/01/2024 [facility]   BREAST BIOPSY Right 05/01/2024   US  RT BREAST BX W LOC DEV 1ST LESION IMG BX SPEC US  GUIDE 05/01/2024 [facility]    Social History   Socioeconomic History   Marital status: Single    Spouse name: Not on file   Number of children: Not on file   Years of education: Not on file   Highest education level: Not on file  Occupational History   Not on file  Tobacco Use   Smoking status: Never   Smokeless tobacco: Not on file  Substance and Sexual Activity   Alcohol use: Yes   Drug use: No   Sexual activity: Not on file  Other Topics Concern   Not on file  Social History Narrative   Not on file   Social Drivers of Health   Financial Resource Strain: Low Risk   (05/08/2024)   Overall Financial Resource Strain (CARDIA)    Difficulty of Paying Living Expenses: Not hard at all  Food Insecurity: No Food Insecurity (05/08/2024)   Hunger Vital Sign    Worried About Running Out of Food in the Last Year: Never true    Ran Out of Food in the Last Year: Never true  Transportation Needs: No Transportation Needs (05/08/2024)   PRAPARE - Administrator, Civil Service (Medical): No    Lack of Transportation (Non-Medical): No  Physical Activity: Inactive (05/08/2024)   Exercise Vital Sign    Days of Exercise per Week: 0 days    Minutes of Exercise per Session: 0 min  Stress: No Stress Concern Present (05/08/2024)   [employer] of Occupational Health - Occupational Stress Questionnaire    Feeling of Stress: Not at all  Social Connections: Unknown (05/08/2024)   Social Connection and Isolation Panel    Frequency of Communication with Friends and Family: More than three times a week    Frequency of Social Gatherings with Friends and Family: More than three times a week    Attends Religious Services: More than 4 times per year    Active Member of [employer] or Organizations: Yes    Attends Banker Meetings: More than 4 times per year    Marital Status: Not on file  Intimate Partner Violence: Not At Risk (05/08/2024)   Humiliation, Afraid, Rape, and Kick questionnaire    Fear of Current or Ex-Partner: No    Emotionally Abused: No    Physically Abused: No    Sexually Abused: No     Family History  Problem Relation Age of Onset   CVA Mother    Heart attack Father      Current Outpatient Medications:    aluminum -magnesium  hydroxide-simethicone  (MAALOX) 200-200-20 MG/5ML SUSP, Take 30 mLs by mouth 4 (four) times daily -  before meals and at bedtime., Disp: 355 mL, Rfl: 0   cetirizine (ZYRTEC) 10 MG tablet, Take 10 mg by mouth daily., Disp: , Rfl:    famotidine  (PEPCID ) 20 MG tablet, Take 1 tablet (20 mg total) by mouth 2 (two) times  daily., Disp: 60 tablet, Rfl: 0   losartan (COZAAR) 100 MG tablet, Take 100 mg by mouth daily., Disp: , Rfl:    omeprazole (PRILOSEC) 20 MG capsule, Take 20 mg by mouth daily., Disp: , Rfl:    sertraline (ZOLOFT) 25 MG tablet, Take 25 mg by mouth daily., Disp: , Rfl:    simvastatin (ZOCOR) 20 MG tablet, Take 20 mg by mouth daily., Disp: , Rfl:    traMADol  (ULTRAM ) 50 MG tablet, Take 1 tablet (50 mg total) by mouth every 6 (six) hours as needed., Disp: 15 tablet, Rfl: 0   Physical exam:  Vitals:   05/08/24 1502  BP: 118/70  Pulse: 72  Resp: 16  Temp: (!) 97 F (36.1 C)  TempSrc: Tympanic  SpO2: 97%  Weight: 120 lb (54.4 kg)  Height: 5' 1 (1.549 m)   Physical Exam Cardiovascular:  Rate and Rhythm: Normal rate and regular rhythm.     Heart sounds: Normal heart sounds.  Pulmonary:     Effort: Pulmonary effort is normal.     Breath sounds: Normal breath sounds.  Abdominal:     General: Bowel sounds are normal.     Palpations: Abdomen is soft.  Skin:    General: Skin is warm and dry.  Neurological:     Mental Status: She is alert and oriented to person, place, and time.   Breast exam: There is an ill-defined large right breast mass spanning across all 4 breast quadrants measuring close to 8 cm.  No palpable right axillary adenopathy.  There is mild retraction of the right nipple but no overt inflammatory breast cancer.       Latest Ref Rng & Units 05/18/2017    9:31 AM  CMP  Glucose 65 - 99 mg/dL 837   BUN 6 - 20 mg/dL 18   Creatinine 9.55 - 1.00 mg/dL 9.25   Sodium 864 - 854 mmol/L 135   Potassium 3.5 - 5.1 mmol/L 3.9   Chloride 101 - 111 mmol/L 104   CO2 22 - 32 mmol/L 25   Calcium 8.9 - 10.3 mg/dL 9.4       Latest Ref Rng & Units 05/18/2017    9:31 AM  CBC  WBC 3.6 - 11.0 K/uL 5.0   Hemoglobin 12.0 - 16.0 g/dL 87.4   Hematocrit 64.9 - 47.0 % 36.5   Platelets 150 - 440 K/uL 229     No images are attached to the encounter.  US  RT BREAST BX W LOC DEV 1ST  LESION IMG BX SPEC US  GUIDE Addendum Date: 05/06/2024 ADDENDUM REPORT: 05/06/2024 07:35 ADDENDUM: PATHOLOGY revealed: Site 1. Breast, right, needle core biopsy, 8:00 7 cmfn, > 5 min, ribbon clip - INVASIVE MAMMARY CARCINOMA, - OVERALL GRADE: 2 - LYMPHOVASCULAR INVASION: SUSPICIOUS - CANCER LENGTH: 0.9 CM - CALCIFICATIONS: NOT IDENTIFIED. Pathology results are CONCORDANT with imaging findings, per Dr. Norleen Croak. PATHOLOGY revealed: Site 2. Breast, right, needle core biopsy, 2:00 2 cmfn, 11 mm, venus clip :- INVASIVE MAMMARY CARCINOMA,- OVERALL GRADE: 2 - LYMPHOVASCULAR INVASION: SUSPICIOUS - CANCER LENGTH: 1.0 CM - CALCIFICATIONS: NOT IDENTIFIED. Pathology results are CONCORDANT with imaging findings, per Dr. Norleen Croak. PATHOLOGY revealed: Site 3. Lymph node, needle/core biopsy, right axillary, coil hydromark clip - METASTATIC MAMMARY CARCINOMA Pathology results are CONCORDANT with imaging findings, per Dr. Norleen Croak. Pathology results and recommendations below were discussed with patient and niece Meg) by telephone on 05/02/2024 by Rock Hover RN. Patient reported biopsy site within normal limits with slight tenderness at the site. Post biopsy care instructions were reviewed, questions were answered and my direct phone number was provided to patient. Patient was instructed to call [facility] if any concerns or questions arise related to the biopsy. RECOMMENDATIONS: 1. Surgical and oncological consultation. Request for surgical and oncological consultation relayed to Shasta Ada RN at [facility] by Rock Hover RN on 05/03/2024. Pathology results reported by Rock Hover RN on 05/02/2024. Electronically Signed   By: Norleen Croak M.D.   On: 05/06/2024 07:35   Result Date: 05/06/2024 CLINICAL DATA:  BI-RADS 5 masses throughout the RIGHT breast; single enlarged RIGHT axillary lymph node. EXAM: ULTRASOUND GUIDED RIGHT BREAST CORE NEEDLE BIOPSY x2; ULTRASOUND-GUIDED RIGHT AXILLARY  NODE CORE BIOPSY X1 COMPARISON:  Previous exam(s). PROCEDURE: I met with the patient and we discussed the procedure of ultrasound-guided biopsy, including benefits and alternatives.  We discussed the high likelihood of a successful procedure. We discussed the risks of the procedure, including infection, bleeding, tissue injury, clip migration, and inadequate sampling. Informed written consent was given. The usual time-out protocol was performed immediately prior to the procedure. Site 1: RIGHT breast 8 o'clock 7 cm from the nipple, mass, ribbon clip Lesion quadrant: Lower outer Using sterile technique and 1% lidocaine  and 1% lidocaine  with epinephrine  as local anesthetic, under direct ultrasound visualization, a 14 gauge spring-loaded device was used to perform biopsy of a portion of the dominant RIGHT breast mass positioned at 8 o'clock 7 cm from the nipple using a radial approach. At the conclusion of the procedure ribbon-shaped tissue marker clip was deployed into the biopsy cavity. Site 2: RIGHT breast 2 o'clock 2 cm from the nipple, mass, Venus clip Lesion quadrant: Upper inner Using sterile technique and 1% lidocaine  and 1% lidocaine  with epinephrine  as local anesthetic, under direct ultrasound visualization, a 14 gauge spring-loaded device was used to perform biopsy of the RIGHT breast mass at 2 o'clock 2 cm from the nipple using a inferior approach. At the conclusion of the procedure Venus-shaped tissue marker clip was deployed into the biopsy cavity. Site 3: RIGHT axillary lymph node, HydroMARK coil clip Lesion quadrant: Axilla Using sterile technique and 1% lidocaine  and 1% lidocaine  with epinephrine  as local anesthetic, under direct ultrasound visualization, a 14 gauge spring-loaded device was used to perform biopsy of the enlarged RIGHT axillary lymph node using a inferior approach. At the conclusion of the procedure [facility] coil-shaped tissue marker clip was deployed into the biopsy cavity. Follow up 2  view mammogram was performed and dictated separately. IMPRESSION: Ultrasound guided biopsy of RIGHT breast masses at 8 and 2 o'clock and an enlarged RIGHT axillary lymph node. No apparent complications. Electronically Signed: By: Norleen Croak M.D. On: 05/01/2024 14:29   US  RT BREAST BX W LOC DEV EA ADD LESION IMG BX SPEC US  GUIDE Addendum Date: 05/06/2024 ADDENDUM REPORT: 05/06/2024 07:35 ADDENDUM: PATHOLOGY revealed: Site 1. Breast, right, needle core biopsy, 8:00 7 cmfn, > 5 min, ribbon clip - INVASIVE MAMMARY CARCINOMA, - OVERALL GRADE: 2 - LYMPHOVASCULAR INVASION: SUSPICIOUS - CANCER LENGTH: 0.9 CM - CALCIFICATIONS: NOT IDENTIFIED. Pathology results are CONCORDANT with imaging findings, per Dr. Norleen Croak. PATHOLOGY revealed: Site 2. Breast, right, needle core biopsy, 2:00 2 cmfn, 11 mm, venus clip :- INVASIVE MAMMARY CARCINOMA,- OVERALL GRADE: 2 - LYMPHOVASCULAR INVASION: SUSPICIOUS - CANCER LENGTH: 1.0 CM - CALCIFICATIONS: NOT IDENTIFIED. Pathology results are CONCORDANT with imaging findings, per Dr. Norleen Croak. PATHOLOGY revealed: Site 3. Lymph node, needle/core biopsy, right axillary, coil hydromark clip - METASTATIC MAMMARY CARCINOMA Pathology results are CONCORDANT with imaging findings, per Dr. Norleen Croak. Pathology results and recommendations below were discussed with patient and niece Meg) by telephone on 05/02/2024 by Rock Hover RN. Patient reported biopsy site within normal limits with slight tenderness at the site. Post biopsy care instructions were reviewed, questions were answered and my direct phone number was provided to patient. Patient was instructed to call [facility] if any concerns or questions arise related to the biopsy. RECOMMENDATIONS: 1. Surgical and oncological consultation. Request for surgical and oncological consultation relayed to Shasta Ada RN at [facility] by Rock Hover RN on 05/03/2024. Pathology results reported by Rock Hover RN on  05/02/2024. Electronically Signed   By: Norleen Croak M.D.   On: 05/06/2024 07:35   Result Date: 05/06/2024 CLINICAL DATA:  BI-RADS 5 masses throughout the  RIGHT breast; single enlarged RIGHT axillary lymph node. EXAM: ULTRASOUND GUIDED RIGHT BREAST CORE NEEDLE BIOPSY x2; ULTRASOUND-GUIDED RIGHT AXILLARY NODE CORE BIOPSY X1 COMPARISON:  Previous exam(s). PROCEDURE: I met with the patient and we discussed the procedure of ultrasound-guided biopsy, including benefits and alternatives. We discussed the high likelihood of a successful procedure. We discussed the risks of the procedure, including infection, bleeding, tissue injury, clip migration, and inadequate sampling. Informed written consent was given. The usual time-out protocol was performed immediately prior to the procedure. Site 1: RIGHT breast 8 o'clock 7 cm from the nipple, mass, ribbon clip Lesion quadrant: Lower outer Using sterile technique and 1% lidocaine  and 1% lidocaine  with epinephrine  as local anesthetic, under direct ultrasound visualization, a 14 gauge spring-loaded device was used to perform biopsy of a portion of the dominant RIGHT breast mass positioned at 8 o'clock 7 cm from the nipple using a radial approach. At the conclusion of the procedure ribbon-shaped tissue marker clip was deployed into the biopsy cavity. Site 2: RIGHT breast 2 o'clock 2 cm from the nipple, mass, Venus clip Lesion quadrant: Upper inner Using sterile technique and 1% lidocaine  and 1% lidocaine  with epinephrine  as local anesthetic, under direct ultrasound visualization, a 14 gauge spring-loaded device was used to perform biopsy of the RIGHT breast mass at 2 o'clock 2 cm from the nipple using a inferior approach. At the conclusion of the procedure Venus-shaped tissue marker clip was deployed into the biopsy cavity. Site 3: RIGHT axillary lymph node, HydroMARK coil clip Lesion quadrant: Axilla Using sterile technique and 1% lidocaine  and 1% lidocaine  with epinephrine  as  local anesthetic, under direct ultrasound visualization, a 14 gauge spring-loaded device was used to perform biopsy of the enlarged RIGHT axillary lymph node using a inferior approach. At the conclusion of the procedure [facility] coil-shaped tissue marker clip was deployed into the biopsy cavity. Follow up 2 view mammogram was performed and dictated separately. IMPRESSION: Ultrasound guided biopsy of RIGHT breast masses at 8 and 2 o'clock and an enlarged RIGHT axillary lymph node. No apparent complications. Electronically Signed: By: Norleen Croak M.D. On: 05/01/2024 14:29   US  AXILLARY NODE CORE BIOPSY RIGHT Addendum Date: 05/06/2024 ADDENDUM REPORT: 05/06/2024 07:35 ADDENDUM: PATHOLOGY revealed: Site 1. Breast, right, needle core biopsy, 8:00 7 cmfn, > 5 min, ribbon clip - INVASIVE MAMMARY CARCINOMA, - OVERALL GRADE: 2 - LYMPHOVASCULAR INVASION: SUSPICIOUS - CANCER LENGTH: 0.9 CM - CALCIFICATIONS: NOT IDENTIFIED. Pathology results are CONCORDANT with imaging findings, per Dr. Norleen Croak. PATHOLOGY revealed: Site 2. Breast, right, needle core biopsy, 2:00 2 cmfn, 11 mm, venus clip :- INVASIVE MAMMARY CARCINOMA,- OVERALL GRADE: 2 - LYMPHOVASCULAR INVASION: SUSPICIOUS - CANCER LENGTH: 1.0 CM - CALCIFICATIONS: NOT IDENTIFIED. Pathology results are CONCORDANT with imaging findings, per Dr. Norleen Croak. PATHOLOGY revealed: Site 3. Lymph node, needle/core biopsy, right axillary, coil hydromark clip - METASTATIC MAMMARY CARCINOMA Pathology results are CONCORDANT with imaging findings, per Dr. Norleen Croak. Pathology results and recommendations below were discussed with patient and niece Meg) by telephone on 05/02/2024 by Rock Hover RN. Patient reported biopsy site within normal limits with slight tenderness at the site. Post biopsy care instructions were reviewed, questions were answered and my direct phone number was provided to patient. Patient was instructed to call [facility] if any concerns or  questions arise related to the biopsy. RECOMMENDATIONS: 1. Surgical and oncological consultation. Request for surgical and oncological consultation relayed to Shasta Ada RN at [facility] by Rock Hover RN on  05/03/2024. Pathology results reported by Rock Hover RN on 05/02/2024. Electronically Signed   By: Norleen Croak M.D.   On: 05/06/2024 07:35   Result Date: 05/06/2024 CLINICAL DATA:  BI-RADS 5 masses throughout the RIGHT breast; single enlarged RIGHT axillary lymph node. EXAM: ULTRASOUND GUIDED RIGHT BREAST CORE NEEDLE BIOPSY x2; ULTRASOUND-GUIDED RIGHT AXILLARY NODE CORE BIOPSY X1 COMPARISON:  Previous exam(s). PROCEDURE: I met with the patient and we discussed the procedure of ultrasound-guided biopsy, including benefits and alternatives. We discussed the high likelihood of a successful procedure. We discussed the risks of the procedure, including infection, bleeding, tissue injury, clip migration, and inadequate sampling. Informed written consent was given. The usual time-out protocol was performed immediately prior to the procedure. Site 1: RIGHT breast 8 o'clock 7 cm from the nipple, mass, ribbon clip Lesion quadrant: Lower outer Using sterile technique and 1% lidocaine  and 1% lidocaine  with epinephrine  as local anesthetic, under direct ultrasound visualization, a 14 gauge spring-loaded device was used to perform biopsy of a portion of the dominant RIGHT breast mass positioned at 8 o'clock 7 cm from the nipple using a radial approach. At the conclusion of the procedure ribbon-shaped tissue marker clip was deployed into the biopsy cavity. Site 2: RIGHT breast 2 o'clock 2 cm from the nipple, mass, Venus clip Lesion quadrant: Upper inner Using sterile technique and 1% lidocaine  and 1% lidocaine  with epinephrine  as local anesthetic, under direct ultrasound visualization, a 14 gauge spring-loaded device was used to perform biopsy of the RIGHT breast mass at 2 o'clock 2 cm from the nipple using  a inferior approach. At the conclusion of the procedure Venus-shaped tissue marker clip was deployed into the biopsy cavity. Site 3: RIGHT axillary lymph node, HydroMARK coil clip Lesion quadrant: Axilla Using sterile technique and 1% lidocaine  and 1% lidocaine  with epinephrine  as local anesthetic, under direct ultrasound visualization, a 14 gauge spring-loaded device was used to perform biopsy of the enlarged RIGHT axillary lymph node using a inferior approach. At the conclusion of the procedure [facility] coil-shaped tissue marker clip was deployed into the biopsy cavity. Follow up 2 view mammogram was performed and dictated separately. IMPRESSION: Ultrasound guided biopsy of RIGHT breast masses at 8 and 2 o'clock and an enlarged RIGHT axillary lymph node. No apparent complications. Electronically Signed: By: Norleen Croak M.D. On: 05/01/2024 14:29   MM CLIP PLACEMENT RIGHT Result Date: 05/01/2024 CLINICAL DATA:  Status post RIGHT breast ultrasound-guided biopsy x3 EXAM: 3D DIAGNOSTIC RIGHT MAMMOGRAM POST ULTRASOUND BIOPSY COMPARISON:  Previous exam(s). ACR Breast Density Category c: The breasts are heterogeneously dense, which may obscure small masses. FINDINGS: 3D Mammographic images were obtained following ultrasound guided biopsy of RIGHT breast masses at 8 o'clock and 2 o'clock and a RIGHT axillary lymph node. Appropriate positioning of the ribbon-shaped biopsy marking clip at the site of biopsy in the RIGHT breast at 8 o'clock. Appropriate positioning of the Venus-shaped biopsy marking clip at the site of biopsy in the RIGHT breast at 2 o'clock. Appropriate positioning of the HydroMARK coil-shaped biopsy marking clip at the site of biopsy in the RIGHT axilla. IMPRESSION: Appropriate positioning of the biopsy marking clips at the sites of biopsy in the RIGHT breast and axilla. Final Assessment: Post Procedure Mammograms for Marker Placement Electronically Signed   By: Norleen Croak M.D.   On: 05/01/2024 14:33    MM 3D DIAGNOSTIC MAMMOGRAM BILATERAL BREAST Result Date: 04/18/2024 CLINICAL DATA:  Palpable RIGHT breast mass EXAM: DIGITAL DIAGNOSTIC BILATERAL MAMMOGRAM WITH TOMOSYNTHESIS AND CAD; ULTRASOUND  RIGHT BREAST LIMITED TECHNIQUE: Bilateral digital diagnostic mammography and breast tomosynthesis was performed. The images were evaluated with computer-aided detection. ; Targeted ultrasound examination of the right breast was performed COMPARISON:  Previous exam(s). ACR Breast Density Category d: The breasts are extremely dense, which lowers the sensitivity of mammography. FINDINGS: Diagnostic images of the RIGHT breast demonstrate a global increase in density spanning at least 10 cm. There is RIGHT nipple retraction. The retroareolar nipple appears newly thickened in appearance. There is skin thickening inferiorly along the breast. No suspicious mass, distortion, or microcalcifications are identified to suggest presence of malignancy in the LEFT breast. On physical exam, the RIGHT breast is globally hardened. Targeted ultrasound was performed throughout the RIGHT breast. There are multiple contiguous appearing masses noted throughout the RIGHT upper outer and lower outer quadrants which extend to the posterior retroareola into a dominant confluent mass. Exact measurements are difficult to ascertain given multifocality and extent of disease. It spans at least 5.6 cm in craniocaudal extent sonographically and is favored to involve the near entirety of the upper-outer and lower-outer quadrants. Representative conglomeration of masses is documented at 7 o'clock 4 cm from the nipple which measures 4.6 x 2.7 x 4.9 cm. This extends and it appears contiguous with the retroareolar mass at 9 o'clock. An additional representative cluster of masses is documented at 10:30 8 cm from the nipple which measures 3.3 x 1.4 by 1.7 cm. There are similar appearing irregular masses involving predominantly the upper inner quadrant of the  RIGHT breast with some extension into the lower inner quadrant. Representative distinct mass is noted in the RIGHT upper inner breast at 2 o'clock 3 cm from the nipple. This is estimated to measure 4.7 x 1.8 x 4.2 cm. An additional mass is documented at 4 o'clock 4 cm from the nipple which measures 12 x 7 x 17 mm. An additional mass at 3 o'clock 4 cm from the nipple measures 3.8 x 1.6 x 1.2 cm. There is an additional small satellite mass noted at 2 o'clock 5 cm from the nipple which measures 7 x 4 x 9 mm. Targeted ultrasound was performed of the RIGHT axilla. There is a single mildly enlarged RIGHT axillary lymph node noted in the low RIGHT axilla which demonstrates eccentric cortical thickening of 4 mm. IMPRESSION: 1. Constellation of findings are concerning for extensive multicentric malignancy involving all 4 quadrants of the RIGHT breast. Mammographically, the disease likely extends at least 10 cm with likely involvement of the nipple. Recommend ultrasound-guided biopsy of a representative area in the outer RIGHT breast as well as a representative area in the inner RIGHT breast to document multicentric disease. 2. There is skin thickening of the lower RIGHT breast. This is nonspecific but could reflect underlying inflammatory malignancy. Skin punch biopsy could be considered if it would alter clinical management. Recommend management be determined by clinical team. 3. There is a single mildly enlarged low RIGHT axillary lymph node. Recommend ultrasound-guided biopsy for definitive characterization. 4. No mammographic evidence of malignancy in the LEFT breast. RECOMMENDATION: 1. RIGHT breast ultrasound-guided biopsy x2 2. RIGHT axillary ultrasound-guided biopsy x1 I have discussed the findings and recommendations with the patient and her niece. The biopsy procedure was discussed with the patient and questions were answered. Patient expressed their understanding of the biopsy recommendation. Patient will be  scheduled for biopsy at her earliest convenience by the schedulers. Ordering provider will be notified. If applicable, a reminder letter will be sent to the patient regarding the next  appointment. BI-RADS CATEGORY  5: Highly suggestive of malignancy. Electronically Signed   By: Corean Salter M.D.   On: 04/18/2024 16:37   US  LIMITED ULTRASOUND INCLUDING AXILLA RIGHT BREAST Result Date: 04/18/2024 CLINICAL DATA:  Palpable RIGHT breast mass EXAM: DIGITAL DIAGNOSTIC BILATERAL MAMMOGRAM WITH TOMOSYNTHESIS AND CAD; ULTRASOUND RIGHT BREAST LIMITED TECHNIQUE: Bilateral digital diagnostic mammography and breast tomosynthesis was performed. The images were evaluated with computer-aided detection. ; Targeted ultrasound examination of the right breast was performed COMPARISON:  Previous exam(s). ACR Breast Density Category d: The breasts are extremely dense, which lowers the sensitivity of mammography. FINDINGS: Diagnostic images of the RIGHT breast demonstrate a global increase in density spanning at least 10 cm. There is RIGHT nipple retraction. The retroareolar nipple appears newly thickened in appearance. There is skin thickening inferiorly along the breast. No suspicious mass, distortion, or microcalcifications are identified to suggest presence of malignancy in the LEFT breast. On physical exam, the RIGHT breast is globally hardened. Targeted ultrasound was performed throughout the RIGHT breast. There are multiple contiguous appearing masses noted throughout the RIGHT upper outer and lower outer quadrants which extend to the posterior retroareola into a dominant confluent mass. Exact measurements are difficult to ascertain given multifocality and extent of disease. It spans at least 5.6 cm in craniocaudal extent sonographically and is favored to involve the near entirety of the upper-outer and lower-outer quadrants. Representative conglomeration of masses is documented at 7 o'clock 4 cm from the nipple which  measures 4.6 x 2.7 x 4.9 cm. This extends and it appears contiguous with the retroareolar mass at 9 o'clock. An additional representative cluster of masses is documented at 10:30 8 cm from the nipple which measures 3.3 x 1.4 by 1.7 cm. There are similar appearing irregular masses involving predominantly the upper inner quadrant of the RIGHT breast with some extension into the lower inner quadrant. Representative distinct mass is noted in the RIGHT upper inner breast at 2 o'clock 3 cm from the nipple. This is estimated to measure 4.7 x 1.8 x 4.2 cm. An additional mass is documented at 4 o'clock 4 cm from the nipple which measures 12 x 7 x 17 mm. An additional mass at 3 o'clock 4 cm from the nipple measures 3.8 x 1.6 x 1.2 cm. There is an additional small satellite mass noted at 2 o'clock 5 cm from the nipple which measures 7 x 4 x 9 mm. Targeted ultrasound was performed of the RIGHT axilla. There is a single mildly enlarged RIGHT axillary lymph node noted in the low RIGHT axilla which demonstrates eccentric cortical thickening of 4 mm. IMPRESSION: 1. Constellation of findings are concerning for extensive multicentric malignancy involving all 4 quadrants of the RIGHT breast. Mammographically, the disease likely extends at least 10 cm with likely involvement of the nipple. Recommend ultrasound-guided biopsy of a representative area in the outer RIGHT breast as well as a representative area in the inner RIGHT breast to document multicentric disease. 2. There is skin thickening of the lower RIGHT breast. This is nonspecific but could reflect underlying inflammatory malignancy. Skin punch biopsy could be considered if it would alter clinical management. Recommend management be determined by clinical team. 3. There is a single mildly enlarged low RIGHT axillary lymph node. Recommend ultrasound-guided biopsy for definitive characterization. 4. No mammographic evidence of malignancy in the LEFT breast. RECOMMENDATION: 1.  RIGHT breast ultrasound-guided biopsy x2 2. RIGHT axillary ultrasound-guided biopsy x1 I have discussed the findings and recommendations with the patient and her niece.  The biopsy procedure was discussed with the patient and questions were answered. Patient expressed their understanding of the biopsy recommendation. Patient will be scheduled for biopsy at her earliest convenience by the schedulers. Ordering provider will be notified. If applicable, a reminder letter will be sent to the patient regarding the next appointment. BI-RADS CATEGORY  5: Highly suggestive of malignancy. Electronically Signed   By: Corean Salter M.D.   On: 04/18/2024 16:37    Assessment and plan- Patient is a 88 y.o. 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  She has a large right breast mass spanning all 4 breast quadrants with total abnormality close to 10 cm.  There is no evidence of inflammatory breast cancer on exam although based on mammogram there seems to be potential concern for involvement of the right areola.  There is also biopsy-proven right axillary lymph node.  Biopsy was consistent with invasive mammary carcinoma with lobular features ER strongly positive PR negative.  HER2 FISH testing was pending at the time of my visit today but did come back later is negative.  I recommend getting PET scan to complete her staging workup given stage III disease clinically.  If PET scan does not show any evidence of distant metastatic disease ideally patient needs bilateral breast MRI given the lobular histology.  Lobular breast cancers are typically chemo resistance and she would not benefit from neoadjuvant chemotherapy.  Also given her age I am not inclined to offer for her adjuvant chemotherapy either.   She will need upfront mastectomy with sentinel lymph node biopsy.  MRI will help us  sort and if there are any abnormal findings in the left breast and patient will need to discuss with Dr. Cesar if she desires unilateral versus bilateral mastectomy.  Patient would qualify for postmastectomy radiation given the size of her breast mass and positive lymph nodes although this would increase her risk of postmastectomy lymphedema.  Systemic treatment standpoint I am not inclined to offer her adjuvant chemotherapy as stated above.  I will not be sending Oncotype testing.  She would benefit from endocrine therapy and I briefly discussed risks and benefits of both tamoxifen and aromatase inhibitor.  I will plan to start her on letrozole preemptively ahead of her surgery now that we have confirmed that she is HER2 negative.  I will see her after final pathology results after mastectomy are back to discuss further management.  If PET scan shows evidence of distant metastatic disease I will see her sooner as she would not require surgery in that case   Cancer Staging  Malignant neoplasm of overlapping sites of right breast in female, estrogen receptor positive (HCC) Staging form: Breast, AJCC 8th Edition - Clinical: Stage IIIA (cT3, cN1, cM0, G2, ER+, PR-, HER2-) - Signed by Melanee Annah BROCKS, MD on 05/09/2024 Histologic grading system: 3 grade system     Thank you for this kind referral and the opportunity to participate in the care of this patient   Visit Diagnosis 1. Malignant neoplasm of overlapping sites of right breast in female, estrogen receptor positive (HCC)   2. Goals of care, counseling/discussion     Dr. Annah Melanee, MD, MPH [facility] [phone] 05/09/2024

## 2024-05-09 NOTE — Telephone Encounter (Signed)
 Patho report received.

## 2024-05-10 ENCOUNTER — Encounter: Payer: Self-pay | Admitting: Internal Medicine

## 2024-05-13 ENCOUNTER — Ambulatory Visit
Admission: RE | Admit: 2024-05-13 | Discharge: 2024-05-13 | Disposition: A | Discharge status: Home / Self Care | Source: Ambulatory Visit | Attending: Oncology | Admitting: Oncology

## 2024-05-13 DIAGNOSIS — Z170 Estrogen receptor positive status [ER+]: Secondary | ICD-10-CM | POA: Insufficient documentation

## 2024-05-13 DIAGNOSIS — Z85038 Personal history of other malignant neoplasm of large intestine: Secondary | ICD-10-CM | POA: Insufficient documentation

## 2024-05-13 DIAGNOSIS — Z85828 Personal history of other malignant neoplasm of skin: Secondary | ICD-10-CM | POA: Diagnosis not present

## 2024-05-13 DIAGNOSIS — I700 Atherosclerosis of aorta: Secondary | ICD-10-CM | POA: Insufficient documentation

## 2024-05-13 DIAGNOSIS — C50919 Malignant neoplasm of unspecified site of unspecified female breast: Secondary | ICD-10-CM | POA: Diagnosis present

## 2024-05-13 DIAGNOSIS — R935 Abnormal findings on diagnostic imaging of other abdominal regions, including retroperitoneum: Secondary | ICD-10-CM | POA: Diagnosis not present

## 2024-05-13 DIAGNOSIS — K869 Disease of pancreas, unspecified: Secondary | ICD-10-CM | POA: Insufficient documentation

## 2024-05-13 DIAGNOSIS — I2510 Atherosclerotic heart disease of native coronary artery without angina pectoris: Secondary | ICD-10-CM | POA: Insufficient documentation

## 2024-05-13 DIAGNOSIS — C50811 Malignant neoplasm of overlapping sites of right female breast: Secondary | ICD-10-CM | POA: Insufficient documentation

## 2024-05-13 LAB — GLUCOSE, CAPILLARY: Glucose-Capillary: 110 mg/dL — ABNORMAL HIGH (ref 70–99)

## 2024-05-13 MED ORDER — FLUDEOXYGLUCOSE F - 18 (FDG) INJECTION: 6.2000 | Freq: Once | INTRAVENOUS | Status: DC | PRN

## 2024-05-13 MED ORDER — FLUDEOXYGLUCOSE F - 18 (FDG) INJECTION
6.5600 | Freq: Once | INTRAVENOUS | Status: AC | PRN
Start: 2024-05-13 — End: 2024-05-13
  Administered 2024-05-13: 6.56 via INTRAVENOUS

## 2024-05-14 ENCOUNTER — Ambulatory Visit: Payer: Self-pay | Admitting: Oncology

## 2024-05-14 DIAGNOSIS — C50811 Malignant neoplasm of overlapping sites of right female breast: Secondary | ICD-10-CM

## 2024-05-14 NOTE — Telephone Encounter (Addendum)
 When niece Almarie return call tomorrow 05/15/24 per Dr. Melanee when you speak to her please ask the niece if she would like to get started on letrozole  like we had discussed while awaiting surgery date . Also to share with niece per Dr. Rodolph If she does not proceed with MRI we might be missing disease on the other breast. Specially if she might not want chemotherapy then we have to explain that the modified radical mastectomy is palliative. Which is a pretty big surgery to be palliative.

## 2024-05-14 NOTE — Telephone Encounter (Signed)
-----   Message from Annah JAYSON Skene sent at 05/14/2024  1:25 PM EDT ----- Hello Dr. Cesar, FYI PET scan did not show any evidence of distant metastatic disease.  I will follow-up on the pancreatic body lesion after her breast surgery.  This is unrelated to her breast  cancer.  I am planning to move forward with bilateral breast MRI given lobular histology unless you think otherwise.  Please let us  know. Thanks, Annah ----- Message ----- From: Interface, Rad Results In Sent: 05/13/2024  10:57 AM EDT To: Annah JAYSON Skene, MD

## 2024-05-14 NOTE — Telephone Encounter (Signed)
 Per Dr. Melanee PET scan did not show any evidence of distant metastatic disease. I will follow-up on the pancreatic body lesion after her breast surgery;  This is unrelated to her breast cancer.  I am planning to move forward with bilateral breast MRI given lobular histology.  Overall patient and her niece were comfortable with the idea of proceeding with upfront surgery and consideration for adjuvant endocrine therapy alone. Spoke to niece Almarie, informed of above.  Reviewed generally what MRI bilateral breast would entail (face down with openings around breast area, lasts approximately 45 minutes and usually a contrast injection is given IV, et jaynie).  Almarie would like to discuss with her aunt prior to making a decision.  Almarie will call me tomorrow morning with their decision.

## 2024-05-16 MED ORDER — LETROZOLE 2.5 MG PO TABS: 2.5000 mg | ORAL_TABLET | Freq: Every day | ORAL | 3 refills | Status: AC

## 2024-05-16 NOTE — Telephone Encounter (Signed)
 Voicemail received yesterday 05/15/24 at 8:40am from niece Almarie regarding MRI Bilateral breast; best contact number [phone].  This issue has already been addressed this morning.

## 2024-05-16 NOTE — Telephone Encounter (Signed)
 Received secure chat indicating Her niece Almarie left a voicemail requesting a call about MRI. They asked for you to return the call [phone].  Outbound call; was told I have the wrong number.

## 2024-05-16 NOTE — Telephone Encounter (Signed)
 Outbound call; spoke to niece and informed of information indicated below by Dr. Cesar.  Niece states if both Dr. Melanee and Dr. Cesar are agreeable they will move forward with the MRI.  Niece was briefly hesitant; mentioned that she spoke to her sister who is a PA but will move forward regardless.  Niece also agreed to start letrozole  therapy; informed script would be sent to the [facility] off of [address] as requested.  Informed she should be receiving a call soon to schedule MRI; niece verbalized understanding.

## 2024-05-16 NOTE — Telephone Encounter (Signed)
 MRI bilateral breast scheduled 05/22/2024  4:00 PM

## 2024-05-17 ENCOUNTER — Other Ambulatory Visit: Payer: Self-pay

## 2024-05-17 ENCOUNTER — Other Ambulatory Visit: Payer: Self-pay | Admitting: Oncology

## 2024-05-17 MED ORDER — LORAZEPAM 0.5 MG PO TABS: 0.5000 mg | ORAL_TABLET | Freq: Once | ORAL | 0 refills | Status: AC

## 2024-05-17 MED ORDER — DIAZEPAM 5 MG PO TABS: 5.0000 mg | ORAL_TABLET | Freq: Four times a day (QID) | ORAL | 0 refills | Status: AC | PRN

## 2024-05-17 NOTE — Telephone Encounter (Signed)
Yes ok to prescribe 

## 2024-05-17 NOTE — Telephone Encounter (Signed)
 Per secure chat spoke with Martha Munoz at clinic [phone]) explained above said she's confused and that she never has to put a date for her order.  informed authorization is approved under their office. she's still confused.  Can you please give her a call. I have to deacess a patient and do an EKG.   Tawni Ill will follow up with phone call.

## 2024-05-17 NOTE — Telephone Encounter (Signed)
 Per Tawni Ill [facility] can order the MRI for 10/1 since they have the approval.   have them put the order in for 10/1 appt.  Spoke with [employer]

## 2024-05-17 NOTE — Telephone Encounter (Addendum)
 Per Secure chat Martha Munoz is the patient aware that we are cancelling our mri and she will go for mri as ordered by dr cesar?SABRA  Unable to see when Dr. Boone MRI is scheduled; spoke with [place] RN who said she just received approval yesterday but is unaware if it's been scheduled.  Advised to call radiology [phone] to inquire.

## 2024-05-17 NOTE — Telephone Encounter (Signed)
 Spoke with Radiology who confirmed patient is scheduled for MRI bilat breast 10/1 at 4pm and it was Dr. Darold order that has been scheduled.

## 2024-05-20 ENCOUNTER — Telehealth: Payer: Self-pay

## 2024-05-20 MED ORDER — LORAZEPAM 0.5 MG PO TABS: 0.5000 mg | ORAL_TABLET | Freq: Once | ORAL | 0 refills | Status: AC

## 2024-05-20 NOTE — Telephone Encounter (Signed)
 Valium  print showed as PRINT.  Outbound call to inquire if script for Valium  was received on 05/17/24.  Hold time 23+ minutes then phone continued to ring but remained unanswered.  Proceeded to disconnnect call.

## 2024-05-20 NOTE — Telephone Encounter (Signed)
 Ativan  0.5mg  sent to pharmacy Qty=2; Receipt confirmed by pharmacy (05/20/2024  3:13 PM EDT)

## 2024-05-22 ENCOUNTER — Telehealth: Payer: Self-pay

## 2024-05-22 ENCOUNTER — Inpatient Hospital Stay: Attending: Oncology | Admitting: Hospice and Palliative Medicine

## 2024-05-22 ENCOUNTER — Ambulatory Visit
Admission: RE | Admit: 2024-05-22 | Discharge: 2024-05-22 | Disposition: A | Discharge status: Home / Self Care | Source: Ambulatory Visit | Attending: Oncology | Admitting: Oncology

## 2024-05-22 DIAGNOSIS — C50811 Malignant neoplasm of overlapping sites of right female breast: Secondary | ICD-10-CM | POA: Insufficient documentation

## 2024-05-22 DIAGNOSIS — R609 Edema, unspecified: Secondary | ICD-10-CM | POA: Diagnosis not present

## 2024-05-22 DIAGNOSIS — N6310 Unspecified lump in the right breast, unspecified quadrant: Secondary | ICD-10-CM | POA: Diagnosis not present

## 2024-05-22 DIAGNOSIS — Z79811 Long term (current) use of aromatase inhibitors: Secondary | ICD-10-CM | POA: Insufficient documentation

## 2024-05-22 DIAGNOSIS — R590 Localized enlarged lymph nodes: Secondary | ICD-10-CM | POA: Diagnosis not present

## 2024-05-22 DIAGNOSIS — C773 Secondary and unspecified malignant neoplasm of axilla and upper limb lymph nodes: Secondary | ICD-10-CM | POA: Insufficient documentation

## 2024-05-22 DIAGNOSIS — Z170 Estrogen receptor positive status [ER+]: Secondary | ICD-10-CM | POA: Insufficient documentation

## 2024-05-22 MED ORDER — GADOBUTROL 1 MMOL/ML IV SOLN
5.0000 mL | Freq: Once | INTRAVENOUS | Status: AC | PRN
Start: 2024-05-22 — End: 2024-05-22
  Administered 2024-05-22: 5 mL via INTRAVENOUS

## 2024-05-22 NOTE — Telephone Encounter (Signed)
 I called and spoke to niece and she will get the pill for the MRi today at 4.

## 2024-05-22 NOTE — Telephone Encounter (Signed)
 Per secure chat received the niece asked for valium  for the MRI, Melanee put it in with valium  and then she did not want to give that. she sent a ativan  0.5 to take I saw in her computer but I can't see in my computer. Melanee said to see if Adolf [place]. the appt is 4 pm today.  Outbound call to [facility] on [address]; spoke to Victor who confirmed script for 2 tablets ativan  received 05/20/24, he is cancelling out the order for the 1 tablet sent on 9/26.

## 2024-05-22 NOTE — Progress Notes (Signed)
 Multidisciplinary Oncology Council Documentation  Martha Munoz was presented by our [facility] on 05/22/2024, which included representatives from:  Palliative Care Dietitian  Physical/Occupational Therapist Nurse Navigator Genetics Social work Survivorship Engineer, site   Martha Munoz  currently presents with history of breast cancer  We reviewed previous medical and familial history, history of present illness, and recent lab results along with all available histopathologic and imaging studies. The MOC considered available treatment options and made the following recommendations/referrals:  SW  The MOC is a meeting of clinicians from various specialty areas who evaluate and discuss patients for whom a multidisciplinary approach is being considered. Final determinations in the plan of care are those of the provider(s).   Today's extended care, comprehensive team conference, Martha Munoz  was not present for the discussion and was not examined.

## 2024-05-23 ENCOUNTER — Telehealth: Payer: Self-pay

## 2024-05-23 NOTE — Telephone Encounter (Signed)
 Clinical Social Work Intern contacted patient by phone per [facility] referral to assess for psychosocial needs. Pt was busy at the time and requested a call back. Intern will call again later in the day.  Martha Munoz B. Delores Clinical Social Work Intern [employer]

## 2024-05-24 ENCOUNTER — Ambulatory Visit: Payer: Self-pay | Admitting: Oncology

## 2024-05-27 ENCOUNTER — Telehealth: Payer: Self-pay

## 2024-05-27 NOTE — Telephone Encounter (Signed)
 Clinical Social Work was referred by [facility] for assessment of psychosocial needs.  CSW attempted to contact patient by phone after Hildegard Daring, CSW Intern, contacted patient..  Left voicemail with contact information and request for return call.

## 2024-05-29 ENCOUNTER — Encounter: Payer: Self-pay | Admitting: *Deleted

## 2024-05-29 ENCOUNTER — Other Ambulatory Visit: Payer: Self-pay

## 2024-05-29 ENCOUNTER — Ambulatory Visit: Payer: Self-pay | Admitting: General Surgery

## 2024-05-29 ENCOUNTER — Encounter
Admission: RE | Admit: 2024-05-29 | Discharge: 2024-05-29 | Disposition: A | Discharge status: Home / Self Care | Source: Ambulatory Visit | Attending: General Surgery | Admitting: General Surgery

## 2024-05-29 DIAGNOSIS — Z01810 Encounter for preprocedural cardiovascular examination: Secondary | ICD-10-CM

## 2024-05-29 DIAGNOSIS — Z170 Estrogen receptor positive status [ER+]: Secondary | ICD-10-CM | POA: Diagnosis not present

## 2024-05-29 DIAGNOSIS — C50911 Malignant neoplasm of unspecified site of right female breast: Secondary | ICD-10-CM

## 2024-05-29 DIAGNOSIS — I10 Essential (primary) hypertension: Secondary | ICD-10-CM

## 2024-05-29 DIAGNOSIS — C50811 Malignant neoplasm of overlapping sites of right female breast: Secondary | ICD-10-CM | POA: Diagnosis not present

## 2024-05-29 HISTORY — DX: Sleep apnea, unspecified: G47.30

## 2024-05-29 HISTORY — DX: Anemia, unspecified: D64.9

## 2024-05-29 HISTORY — DX: Gastro-esophageal reflux disease without esophagitis: K21.9

## 2024-05-29 HISTORY — DX: Malignant neoplasm of unspecified site of unspecified female breast: C50.919

## 2024-05-29 HISTORY — DX: Depression, unspecified: F32.A

## 2024-05-29 HISTORY — DX: Other specified conditions associated with female genital organs and menstrual cycle: N94.89

## 2024-05-29 NOTE — Patient Instructions (Signed)
 Your procedure is scheduled on:06-03-24 Monday Report to the Registration Desk on the 1st floor of the Medical Mall.Then proceed to the 2nd floor Surgery Desk To find out your arrival time, please call [phone] between 1PM - 3PM on:05-31-24 Friday If your arrival time is 6:00 am, do not arrive before that time as the Medical Mall entrance doors do not open until 6:00 am.  REMEMBER: Instructions that are not followed completely may result in serious medical risk, up to and including death; or upon the discretion of your surgeon and anesthesiologist your surgery may need to be rescheduled.  Do not eat food OR drink liquids after midnight the night before surgery.  No gum chewing or hard candies.  One week prior to surgery:Stop NOW (05-29-24) Stop Anti-inflammatories (NSAIDS) such as Advil, Aleve, Ibuprofen, Motrin, Naproxen, Naprosyn and Aspirin based products such as Excedrin, Goody's Powder, BC Powder. Stop ANY OVER THE COUNTER supplements until after surgery.  You may however, continue to take Tylenol if needed for pain up until the day of surgery.  Continue taking all of your other prescription medications up until the day of surgery.  Do NOT take any medication the day of surgery  No Alcohol for 24 hours before or after surgery.  No Smoking including e-cigarettes for 24 hours before surgery.  No chewable tobacco products for at least 6 hours before surgery.  No nicotine patches on the day of surgery.  Do not use any recreational drugs for at least a week (preferably 2 weeks) before your surgery.  Please be advised that the combination of cocaine and anesthesia may have negative outcomes, up to and including death. If you test positive for cocaine, your surgery will be cancelled.  On the morning of surgery brush your teeth with toothpaste and water, you may rinse your mouth with mouthwash if you wish. Do not swallow any toothpaste or mouthwash.  Use CHG Soap as directed on  instruction sheet.  Do not wear jewelry, make-up, hairpins, clips or nail polish.  For welded (permanent) jewelry: bracelets, anklets, waist bands, etc.  Please have this removed prior to surgery.  If it is not removed, there is a chance that hospital personnel will need to cut it off on the day of surgery.  Do not wear lotions, powders, or perfumes.   Do not shave body hair from the neck down 48 hours before surgery.  Contact lenses, hearing aids and dentures may not be worn into surgery.  Do not bring valuables to the hospital. [facility] is not responsible for any missing/lost belongings or valuables.   Notify your doctor if there is any change in your medical condition (cold, fever, infection).  Wear comfortable clothing (specific to your surgery type) to the hospital.  After surgery, you can help prevent lung complications by doing breathing exercises.  Take deep breaths and cough every 1-2 hours. Your doctor may order a device called an Incentive Spirometer to help you take deep breaths. When coughing or sneezing, hold a pillow firmly against your incision with both hands. This is called "splinting." Doing this helps protect your incision. It also decreases belly discomfort.  If you are being admitted to the hospital overnight, leave your suitcase in the car. After surgery it may be brought to your room.  In case of increased patient census, it may be necessary for you, the patient, to continue your postoperative care in the Same Day Surgery department.  If you are being discharged the day of surgery,  you will not be allowed to drive home. You will need a responsible individual to drive you home and stay with you for 24 hours after surgery.   If you are taking public transportation, you will need to have a responsible individual with you.  Please call the Pre-admissions Testing Dept. at [phone] if you have any questions about these instructions.  Surgery Visitation  Policy:  Patients having surgery or a procedure may have two visitors.  Children under the age of 26 must have an adult with them who is not the patient.  Inpatient Visitation:    Visiting hours are 7 a.m. to 8 p.m. Up to four visitors are allowed at one time in a patient room. The visitors may rotate out with other people during the day.  One visitor age 75 or older may stay with the patient overnight and must be in the room by 8 p.m.                                                                                                             Preparing for Surgery with CHLORHEXIDINE GLUCONATE (CHG) Soap  Chlorhexidine Gluconate (CHG) Soap  o An antiseptic cleaner that kills germs and bonds with the skin to continue killing germs even after washing  o Used for showering the night before surgery and morning of surgery  Before surgery, you can play an important role by reducing the number of germs on your skin.  CHG (Chlorhexidine gluconate) soap is an antiseptic cleanser which kills germs and bonds with the skin to continue killing germs even after washing.  Please do not use if you have an allergy to CHG or antibacterial soaps. If your skin becomes reddened/irritated stop using the CHG.  1. Shower the NIGHT BEFORE SURGERY with CHG soap.  2. If you choose to wash your hair, wash your hair first as usual with your normal shampoo.  3. After shampooing, rinse your hair and body thoroughly to remove the shampoo.  4. Use CHG as you would any other liquid soap. You can apply CHG directly to the skin and wash gently with a clean washcloth.  5. Apply the CHG soap to your body only from the neck down. Do not use on open wounds or open sores. Avoid contact with your eyes, ears, mouth, and genitals (private parts). Wash face and genitals (private parts) with your normal soap.  6. Wash thoroughly, paying special attention to the area where your surgery will be performed.  7. Thoroughly rinse  your body with warm water.  8. Do not shower/wash with your normal soap after using and rinsing off the CHG soap.  9. Do not use lotions, oils, etc., after showering with CHG.  10. Pat yourself dry with a clean towel.  11. Wear clean pajamas to bed the night before surgery.  12. Place clean sheets on your bed the night of your shower and do not sleep with pets.  13. Do not apply any deodorants/lotions/powders.  14. Please wear clean clothes to the hospital.  15. Remember to brush your teeth with your regular toothpaste.  Merchandiser, retail to address health-related social needs:  [email or web address]

## 2024-05-29 NOTE — Progress Notes (Signed)
 Martha Munoz is scheduled for her mastectomy on 10/13.  Dr. Melanee will see her back on 10/31.  Appt. Details given to her niece.

## 2024-05-29 NOTE — H&P (View-Only) (Signed)
 History of Present Illness Martha Munoz  A Splinter is an 88 year old female with advanced right breast cancer who presents for discussion of a modified radical mastectomy. She is accompanied by her niece, who is involved in her care discussions.  She has advanced right breast cancer, characterized as multicentric invasive carcinoma, with lymph node metastasis to the right axillary lymph node. A PET scan on May 13, 2034, showed no signs of distant metastatic disease. A bilateral breast MRI on May 22, 2034, confirmed multicentric masses in the right breast, with no evidence of disease in the left breast.  The burden of cancer on her right breast is impressive with basically most of her breast replaced by the malignant mass.  She noticed the mass when her arm brushed against her breast. She has not had a mammogram in a couple of years, which was reportedly normal at that time. She is concerned about the extent of the surgery and the subsequent need for chemotherapy and radiation therapy.  She reports drinking large amounts of fluids quickly, which leads to frequent urination.       PAST MEDICAL HISTORY:  Past Medical History:  Diagnosis Date  . Adnexal mass   . Anemia   . Back pain   . Cancer (CMS/HHS-HCC)    colon cancer  . Chest pain   . H/O exercise stress test 2009   negative  . Hyperlipidemia   . Hypertension   . Osteoarthritis   . Sleep apnea    mild        PAST SURGICAL HISTORY:   Past Surgical History:  Procedure Laterality Date  . COLONOSCOPY  09/24/2003   [facility] w/Colon Resection in 1995  . COLONOSCOPY  11/25/2003   [facility]  . COLONOSCOPY  01/15/2008   [facility]  . EGD  01/15/2008  . COLONOSCOPY  05/14/2012   [facility]: [facility] 04/2017; Recall Ltr mailed 03/28/2017 (dw)  . EGD  03/03/2014   No repeat per RTE  . OOPHORECTOMY  09/22/14   bilateral salpingectomy  . BREAST SURGERY    . nasal surgery    . PELVIC LAPAROSCOPY    . TONSILLECTOMY AND ADENOIDECTOMY            MEDICATIONS:  Outpatient Encounter Medications as of 05/29/2024  Medication Sig Dispense Refill  . letrozole  (FEMARA ) 2.5 mg tablet Take 2.5 mg by mouth once daily    . losartan (COZAAR) 25 MG tablet Take 1 tablet (25 mg total) by mouth once daily 90 tablet 1  . metoprolol SUCCinate (TOPROL-XL) 25 MG XL tablet TAKE 1 TABLET(25 MG) BY MOUTH EVERY DAY 90 tablet 1  . sertraline (ZOLOFT) 25 MG tablet Take 1 tablet (25 mg total) by mouth once daily 90 tablet 1  . simvastatin (ZOCOR) 20 MG tablet TAKE 1 TABLET(20 MG) BY MOUTH AT BEDTIME 90 tablet 1   No facility-administered encounter medications on file as of 05/29/2024.     ALLERGIES:   Patient has no known allergies.   SOCIAL HISTORY:  Social History   Socioeconomic History  . Marital status: Single  Tobacco Use  . Smoking status: Former    Current packs/day: 0.00    Average packs/day: 1.5 packs/day for 40.0 years (60.0 ttl pk-yrs)    Types: Cigarettes    Start date: 08/22/1953    Quit date: 08/22/1993    Years since quitting: 30.7  . Smokeless tobacco: Never  Vaping Use  . Vaping status: Never Used  Substance and Sexual Activity  . Alcohol use: Yes  Alcohol/week: 7.0 standard drinks of alcohol    Types: 7 Shots of liquor per week    Comment: Drinks 1 martini daily  . Drug use: No  . Sexual activity: Defer   Social Drivers of Health   Financial Resource Strain: Low Risk  (05/08/2024)   Received from [facility]   Overall Financial Resource Strain (CARDIA)   . How hard is it for you to pay for the very basics like food, housing, medical care, and heating?: Not hard at all  Food Insecurity: No Food Insecurity (05/08/2024)   Received from [facility]   Hunger Vital Sign   . Within the past 12 months, you worried that your food would run out before you got the money to buy more.: Never true   . Within the past 12 months, the food you bought just didn't last and you didn't have money to get more.: Never true  Transportation  Needs: No Transportation Needs (05/08/2024)   Received from [facility] - Transportation   . In the past 12 months, has lack of transportation kept you from medical appointments or from getting medications?: No   . In the past 12 months, has lack of transportation kept you from meetings, work, or from getting things needed for daily living?: No    FAMILY HISTORY:  Family History  Problem Relation Name Age of Onset  . High blood pressure (Hypertension) Mother    . Stroke Mother    . Myocardial Infarction (Heart attack) Father    . Arthritis Brother       GENERAL REVIEW OF SYSTEMS:   General ROS: negative for - chills, fatigue, fever, weight gain or weight loss Allergy and Immunology ROS: negative for - hives  Hematological and Lymphatic ROS: negative for - bleeding problems or bruising, negative for palpable nodes Endocrine ROS: negative for - heat or cold intolerance, hair changes Respiratory ROS: negative for - cough, shortness of breath or wheezing Cardiovascular ROS: no chest pain or palpitations GI ROS: negative for nausea, vomiting, abdominal pain, diarrhea, constipation Musculoskeletal ROS: negative for - joint swelling or muscle pain Neurological ROS: negative for - confusion, syncope Dermatological ROS: negative for pruritus and rash  PHYSICAL EXAM:  Vitals:   05/29/24 1039  BP: 126/58  .  Ht:154.9 cm (5' 1) Wt:54.9 kg (121 lb) ADJ:Anib surface area is 1.54 meters squared. Body mass index is 22.86 kg/m.SABRA   GENERAL: Alert, active, oriented x3  HEENT: Pupils equal reactive to light. Extraocular movements are intact. Sclera clear. Palpebral conjunctiva normal red color.Pharynx clear.  NECK: Supple with no palpable mass and no adenopathy.  LUNGS: Sound clear with no rales rhonchi or wheezes.  HEART: Regular rhythm S1 and S2 without murmur.  BREAST: Huge breast mass of the right breast without active ulceration.  There is palpable axillary adenopathy on  the right axilla.  No masses, skin changes or axillary adenopathy on the left side.  EXTREMITIES: Well-developed well-nourished symmetrical with no dependent edema.  NEUROLOGICAL: Awake alert oriented, facial expression symmetrical, moving all extremities.   Results RADIOLOGY PET scan: No sign of distant metastatic disease (05/13/2024) Bilateral breast MRI: Multicentric right breast masses (05/22/2024)  PATHOLOGY Multicentric invasive lobular carcinoma, ER positive, HER2 negative with lymph node metastasis to the right axillary lymph node    Assessment & Plan Advanced right breast cancer, multicentric invasive lobular carcinoma, ER positive, HER2 negative, with right axillary lymph node metastasis   Extensive multicentric invasive lobular carcinoma affects more than  half of the right breast tissue and right axillary lymph nodes, confirmed by bilateral breast MRI. PET scan shows no distant metastasis. Surgery, chemotherapy, and radiation therapy are indicated due to the size of the mass and lymph node involvement. Chemotherapy and radiation are crucial for addressing potential micrometastatic disease. Concerns about chemotherapy were addressed, emphasizing the importance of full treatment for optimal outcome.  I was very clear with the patient and her niece that just proceeding with surgery would not be curative in these advanced breast cancer.  If no adjuvant radiation or chemotherapy are given, surgery will be for palliative purposes to avoid further ulceration of the skin.  Patient is pretty independent, lives by herself, he drives every day.    A modified radical mastectomy of the right breast and axillary lymph node dissection is scheduled for Monday, with an overnight hospital stay. A drain will be placed post-surgery to manage fluid accumulation, monitored and potentially removed after 1-2 weeks. Education on drain care and post-operative care, including the use of a compression binder or  specialized bra, will be provided. Strong pain medication will be prescribed, with acetaminophen, ibuprofen, and ice packs as first-line pain control. A high-protein diet is encouraged to aid healing. Weekly follow-up appointments will monitor healing and drain status. Referral to occupational therapy will assist with arm movement and swelling management. Coordination with insurance for potential nursing care post-discharge is underway. Chemotherapy and radiation therapy will be discussed with the oncology team post-surgery.   Malignant neoplasm of overlapping sites of right breast in female, estrogen receptor positive (CMS/HHS-HCC) [C50.811, Z17.0]          Patient and her niece verbalized understanding, all questions were answered, and were agreeable with the plan outlined above.   Lucas Sjogren, MD  Electronically signed by Lucas Sjogren, MD

## 2024-05-31 ENCOUNTER — Encounter
Admission: RE | Admit: 2024-05-31 | Discharge: 2024-05-31 | Disposition: A | Discharge status: Home / Self Care | Source: Ambulatory Visit | Attending: General Surgery | Admitting: General Surgery

## 2024-05-31 DIAGNOSIS — I10 Essential (primary) hypertension: Secondary | ICD-10-CM | POA: Diagnosis not present

## 2024-05-31 DIAGNOSIS — Z01810 Encounter for preprocedural cardiovascular examination: Secondary | ICD-10-CM | POA: Diagnosis not present

## 2024-06-03 ENCOUNTER — Other Ambulatory Visit: Payer: Self-pay

## 2024-06-03 ENCOUNTER — Ambulatory Visit

## 2024-06-03 ENCOUNTER — Encounter: Payer: Self-pay | Admitting: General Surgery

## 2024-06-03 ENCOUNTER — Observation Stay
Admission: RE | Admit: 2024-06-03 | Discharge: 2024-06-04 | Disposition: A | Discharge status: Home / Self Care | Attending: General Surgery | Admitting: General Surgery

## 2024-06-03 ENCOUNTER — Encounter
Admission: RE | Disposition: A | Discharge status: Home / Self Care | Payer: Self-pay | Source: Home / Self Care | Attending: General Surgery

## 2024-06-03 ENCOUNTER — Ambulatory Visit: Payer: Self-pay | Admitting: Urgent Care

## 2024-06-03 DIAGNOSIS — G4730 Sleep apnea, unspecified: Secondary | ICD-10-CM | POA: Diagnosis not present

## 2024-06-03 DIAGNOSIS — C773 Secondary and unspecified malignant neoplasm of axilla and upper limb lymph nodes: Secondary | ICD-10-CM | POA: Insufficient documentation

## 2024-06-03 DIAGNOSIS — Z85038 Personal history of other malignant neoplasm of large intestine: Secondary | ICD-10-CM | POA: Diagnosis not present

## 2024-06-03 DIAGNOSIS — Z79899 Other long term (current) drug therapy: Secondary | ICD-10-CM | POA: Diagnosis not present

## 2024-06-03 DIAGNOSIS — I10 Essential (primary) hypertension: Secondary | ICD-10-CM | POA: Insufficient documentation

## 2024-06-03 DIAGNOSIS — Z170 Estrogen receptor positive status [ER+]: Secondary | ICD-10-CM | POA: Diagnosis not present

## 2024-06-03 DIAGNOSIS — C50811 Malignant neoplasm of overlapping sites of right female breast: Secondary | ICD-10-CM | POA: Diagnosis not present

## 2024-06-03 DIAGNOSIS — C50919 Malignant neoplasm of unspecified site of unspecified female breast: Principal | ICD-10-CM | POA: Diagnosis present

## 2024-06-03 DIAGNOSIS — C50111 Malignant neoplasm of central portion of right female breast: Secondary | ICD-10-CM | POA: Diagnosis not present

## 2024-06-03 DIAGNOSIS — Z87891 Personal history of nicotine dependence: Secondary | ICD-10-CM | POA: Insufficient documentation

## 2024-06-03 DIAGNOSIS — F32A Depression, unspecified: Secondary | ICD-10-CM | POA: Diagnosis not present

## 2024-06-03 HISTORY — PX: MASTECTOMY MODIFIED RADICAL: SHX5962

## 2024-06-03 SURGERY — MASTECTOMY, MODIFIED RADICAL
Anesthesia: General | Site: Breast | Laterality: Right

## 2024-06-03 MED ORDER — PHENYLEPHRINE 80 MCG/ML (10ML) SYRINGE FOR IV PUSH (FOR BLOOD PRESSURE SUPPORT)
PREFILLED_SYRINGE | INTRAVENOUS | Status: DC | PRN
  Administered 2024-06-03 (×3): 80 ug via INTRAVENOUS

## 2024-06-03 MED ORDER — LACTATED RINGERS IV SOLN: INTRAVENOUS | Status: DC

## 2024-06-03 MED ORDER — CEFAZOLIN SODIUM-DEXTROSE 2-4 GM/100ML-% IV SOLN
2.0000 g | INTRAVENOUS | Status: AC
  Administered 2024-06-03: 2 g via INTRAVENOUS

## 2024-06-03 MED ORDER — HYDROCODONE-ACETAMINOPHEN 5-325 MG PO TABS: 1.0000 | ORAL_TABLET | ORAL | Status: DC | PRN

## 2024-06-03 MED ORDER — CHLORHEXIDINE GLUCONATE 0.12 % MT SOLN
15.0000 mL | Freq: Once | OROMUCOSAL | Status: AC
  Administered 2024-06-03: 15 mL via OROMUCOSAL

## 2024-06-03 MED ORDER — ACETAMINOPHEN 10 MG/ML IV SOLN
INTRAVENOUS | Status: AC
  Filled 2024-06-03: qty 100

## 2024-06-03 MED ORDER — LOSARTAN POTASSIUM 50 MG PO TABS
100.0000 mg | ORAL_TABLET | Freq: Every day | ORAL | Status: DC
  Administered 2024-06-03 – 2024-06-04 (×2): 100 mg via ORAL
  Filled 2024-06-03 (×2): qty 2

## 2024-06-03 MED ORDER — PROPOFOL 10 MG/ML IV BOLUS
INTRAVENOUS | Status: DC | PRN
  Administered 2024-06-03: 50 mg via INTRAVENOUS
  Administered 2024-06-03: 150 mg via INTRAVENOUS

## 2024-06-03 MED ORDER — CHLORHEXIDINE GLUCONATE 0.12 % MT SOLN
OROMUCOSAL | Status: AC
Start: 2024-06-03 — End: 2024-06-03
  Filled 2024-06-03: qty 15

## 2024-06-03 MED ORDER — LIDOCAINE HCL (CARDIAC) PF 100 MG/5ML IV SOSY
PREFILLED_SYRINGE | INTRAVENOUS | Status: DC | PRN
  Administered 2024-06-03: 80 mg via INTRAVENOUS

## 2024-06-03 MED ORDER — FENTANYL CITRATE (PF) 100 MCG/2ML IJ SOLN
25.0000 ug | INTRAMUSCULAR | Status: DC | PRN
  Administered 2024-06-03: 25 ug via INTRAVENOUS
  Administered 2024-06-03: 50 ug via INTRAVENOUS
  Administered 2024-06-03 (×3): 25 ug via INTRAVENOUS

## 2024-06-03 MED ORDER — LIDOCAINE HCL (PF) 2 % IJ SOLN
INTRAMUSCULAR | Status: AC
  Filled 2024-06-03: qty 5

## 2024-06-03 MED ORDER — STERILE WATER FOR IRRIGATION IR SOLN
Status: DC | PRN
  Administered 2024-06-03: 500 mL

## 2024-06-03 MED ORDER — DEXAMETHASONE SOD PHOSPHATE PF 10 MG/ML IJ SOLN
INTRAMUSCULAR | Status: DC | PRN
  Administered 2024-06-03: 10 mg via INTRAVENOUS

## 2024-06-03 MED ORDER — ENOXAPARIN SODIUM 40 MG/0.4ML IJ SOSY
40.0000 mg | PREFILLED_SYRINGE | INTRAMUSCULAR | Status: DC
  Administered 2024-06-04: 40 mg via SUBCUTANEOUS
  Filled 2024-06-03: qty 0.4

## 2024-06-03 MED ORDER — PROPOFOL 1000 MG/100ML IV EMUL
INTRAVENOUS | Status: AC
  Filled 2024-06-03: qty 100

## 2024-06-03 MED ORDER — ORAL CARE MOUTH RINSE: 15.0000 mL | Freq: Once | OROMUCOSAL | Status: AC

## 2024-06-03 MED ORDER — BUPIVACAINE-EPINEPHRINE (PF) 0.25% -1:200000 IJ SOLN
INTRAMUSCULAR | Status: DC | PRN
  Administered 2024-06-03: 30 mL via PERINEURAL

## 2024-06-03 MED ORDER — FENTANYL CITRATE (PF) 100 MCG/2ML IJ SOLN
INTRAMUSCULAR | Status: AC
  Filled 2024-06-03: qty 2

## 2024-06-03 MED ORDER — DEXMEDETOMIDINE HCL IN NACL 80 MCG/20ML IV SOLN
INTRAVENOUS | Status: DC | PRN
  Administered 2024-06-03 (×2): 4 ug via INTRAVENOUS

## 2024-06-03 MED ORDER — FENTANYL CITRATE (PF) 100 MCG/2ML IJ SOLN
INTRAMUSCULAR | Status: DC | PRN
  Administered 2024-06-03: 50 ug via INTRAVENOUS
  Administered 2024-06-03 (×2): 25 ug via INTRAVENOUS

## 2024-06-03 MED ORDER — HEPARIN SODIUM (PORCINE) 5000 UNIT/ML IJ SOLN
5000.0000 [IU] | Freq: Once | INTRAMUSCULAR | Status: AC
Start: 2024-06-03 — End: 2024-06-03
  Administered 2024-06-03: 5000 [IU] via SUBCUTANEOUS

## 2024-06-03 MED ORDER — DROPERIDOL 2.5 MG/ML IJ SOLN: 0.6250 mg | Freq: Once | INTRAMUSCULAR | Status: DC | PRN

## 2024-06-03 MED ORDER — MORPHINE SULFATE (PF) 2 MG/ML IV SOLN: 2.0000 mg | INTRAVENOUS | Status: DC | PRN

## 2024-06-03 MED ORDER — ROCURONIUM BROMIDE 10 MG/ML (PF) SYRINGE
PREFILLED_SYRINGE | INTRAVENOUS | Status: AC
  Filled 2024-06-03: qty 10

## 2024-06-03 MED ORDER — GLYCOPYRROLATE 0.2 MG/ML IJ SOLN
INTRAMUSCULAR | Status: AC
  Filled 2024-06-03: qty 1

## 2024-06-03 MED ORDER — GLYCOPYRROLATE 0.2 MG/ML IJ SOLN
INTRAMUSCULAR | Status: DC | PRN
  Administered 2024-06-03: .1 mg via INTRAVENOUS

## 2024-06-03 MED ORDER — HEPARIN SODIUM (PORCINE) 5000 UNIT/ML IJ SOLN
INTRAMUSCULAR | Status: AC
  Filled 2024-06-03: qty 1

## 2024-06-03 MED ORDER — ACETAMINOPHEN 10 MG/ML IV SOLN
INTRAVENOUS | Status: DC | PRN
  Administered 2024-06-03: 1000 mg via INTRAVENOUS

## 2024-06-03 MED ORDER — BUPIVACAINE-EPINEPHRINE (PF) 0.25% -1:200000 IJ SOLN
INTRAMUSCULAR | Status: AC
  Filled 2024-06-03: qty 30

## 2024-06-03 MED ORDER — CEFAZOLIN SODIUM-DEXTROSE 2-4 GM/100ML-% IV SOLN
INTRAVENOUS | Status: AC
Start: 2024-06-03 — End: 2024-06-03
  Filled 2024-06-03: qty 100

## 2024-06-03 MED ORDER — ONDANSETRON HCL 4 MG/2ML IJ SOLN
INTRAMUSCULAR | Status: AC
  Filled 2024-06-03: qty 2

## 2024-06-03 MED ORDER — ONDANSETRON 4 MG PO TBDP: 4.0000 mg | ORAL_TABLET | Freq: Four times a day (QID) | ORAL | Status: DC | PRN

## 2024-06-03 MED ORDER — ONDANSETRON HCL 4 MG/2ML IJ SOLN
INTRAMUSCULAR | Status: DC | PRN
  Administered 2024-06-03: 4 mg via INTRAVENOUS

## 2024-06-03 MED ORDER — ONDANSETRON HCL 4 MG/2ML IJ SOLN: 4.0000 mg | Freq: Four times a day (QID) | INTRAMUSCULAR | Status: DC | PRN

## 2024-06-03 MED ORDER — PROPOFOL 10 MG/ML IV BOLUS
INTRAVENOUS | Status: AC
  Filled 2024-06-03: qty 20

## 2024-06-03 MED ORDER — METOPROLOL SUCCINATE ER 25 MG PO TB24
25.0000 mg | ORAL_TABLET | Freq: Every day | ORAL | Status: DC
  Administered 2024-06-03 – 2024-06-04 (×2): 25 mg via ORAL
  Filled 2024-06-03 (×2): qty 1

## 2024-06-03 SURGICAL SUPPLY — 32 items
BINDER BREAST LRG (GAUZE/BANDAGES/DRESSINGS) IMPLANT
CHLORAPREP W/TINT 26 (MISCELLANEOUS) IMPLANT
COVER PROBE GAMMA FINDER SLV (MISCELLANEOUS) ×1 IMPLANT
DERMABOND ADVANCED .7 DNX12 (GAUZE/BANDAGES/DRESSINGS) ×1 IMPLANT
DRAIN CHANNEL JP 15F RND 3/16 (MISCELLANEOUS) IMPLANT
DRAIN CHANNEL JP 19F RND 3/16 (MISCELLANEOUS) IMPLANT
DRAPE LAPAROTOMY TRNSV 106X77 (MISCELLANEOUS) ×1 IMPLANT
DRSG GAUZE FLUFF 36X18 (GAUZE/BANDAGES/DRESSINGS) ×1 IMPLANT
ELECTRODE REM PT RTRN 9FT ADLT (ELECTROSURGICAL) ×1 IMPLANT
EVACUATOR SILICONE 100CC (DRAIN) IMPLANT
GAUZE 4X4 16PLY ~~LOC~~+RFID DBL (SPONGE) IMPLANT
GAUZE SPONGE 4X4 12PLY STRL (GAUZE/BANDAGES/DRESSINGS) ×1 IMPLANT
GLOVE BIO SURGEON STRL SZ 6.5 (GLOVE) ×1 IMPLANT
GLOVE BIOGEL PI IND STRL 6.5 (GLOVE) ×1 IMPLANT
GLOVE SURG SYN 6.5 PF PI (GLOVE) ×2 IMPLANT
GOWN STRL REUS W/ TWL LRG LVL3 (GOWN DISPOSABLE) ×3 IMPLANT
KIT TURNOVER KIT A (KITS) ×1 IMPLANT
LABEL OR SOLS (LABEL) ×1 IMPLANT
MANIFOLD NEPTUNE II (INSTRUMENTS) ×1 IMPLANT
PACK BASIN MAJOR ARMC (MISCELLANEOUS) ×1 IMPLANT
PAD ABD DERMACEA PRESS 5X9 (GAUZE/BANDAGES/DRESSINGS) ×1 IMPLANT
SPONGE T-LAP 18X18 ~~LOC~~+RFID (SPONGE) ×2 IMPLANT
SUT SILK 2 0 SH (SUTURE) IMPLANT
SUT SILK 3 0 SH CR/8 (SUTURE) ×1 IMPLANT
SUT VIC AB 3-0 54X BRD REEL (SUTURE) ×1 IMPLANT
SUT VIC AB 3-0 SH 27X BRD (SUTURE) ×2 IMPLANT
SUT VIC AB 3-0 SH 8-18 (SUTURE) ×1 IMPLANT
SUTURE EHLN 3-0 FS-10 30 BLK (SUTURE) IMPLANT
SUTURE ETHLN 4-0 FS2 18XMF BLK (SUTURE) IMPLANT
SUTURE MNCRL 4-0 27XMF (SUTURE) ×2 IMPLANT
TRAP FLUID SMOKE EVACUATOR (MISCELLANEOUS) ×1 IMPLANT
WATER STERILE IRR 500ML POUR (IV SOLUTION) ×1 IMPLANT

## 2024-06-03 NOTE — Op Note (Signed)
 Preoperative diagnosis: Multicentric Invasive Lobular Carcinoma of the right breast.   Postoperative diagnosis: Same  Procedure: Right modified radical mastectomy.  Anesthesia: GETA  Surgeon: Dr. Cesar Coe  Wound Classification: Clean   Indications: Patient is a 88 y.o. female who had an abnormal mammogram that on workup with core needle biopsy was found to be invasive lobular carcinoma of the breast. Complete workup including Breast MRI, PET Scan showed multicentric right breast cancer occupying essentially the entirety of the right breast without evidence of distant metastatic disease. She was demonstrated to have clinically axillary node involvement. After discussion of alternatives, the patient would benefit best of modified radical mastectomy as oncologic treatment.   Findings: Multiple palpable lymph nodes  Description of procedure: The patient was brought to the operating room and the site of surgery was confirmed. General anesthesia was induced. A time-out was completed verifying correct patient, procedure, site, positioning, and implant(s) and/or special equipment prior to beginning this procedure. The breast, chest wall, axilla, and upper arm and neck were prepped and draped in the usual sterile fashion.  A skin incision was made that encompassed the nipple-areola complex and the previous biopsy scar and passed in a generally oblique direction across the breast. Flaps were raised in the avascular plane between subcutaneous tissue and breast tissue from clavicle superiorly, the sternum medially, the anterior rectus sheath inferiorly, and the anterior border of the latissimus dorsi muscle laterally. Hemostasis was achieved in the flaps. Next, the breast tissue and underlying pectoralis fascia were excised from the pectoralis major muscle, progressing from medially to laterally. At the lateral border of the pectoralis major muscle, the breast tissue was swung laterally and dissection  progressed under the muscle.  The clavipectoral fascia was then incised along the edge of the pectoralis major and the pectoralis major and minor were freed from surrounding fat and nodal tissue. Dissection progressed first under the pectoralis major and then under the pectoralis minor muscle. The pectoral muscles were retracted medially with a Richardson retractor. The medial pectoral neurovascular bundle was identified and preserved. The level II nodal tissue deep in the pectoralis minor was included in the dissection. The axillary vein was then identified and cleared of overlying fat. The first branch off the axillary vein was clamped, divided, and tied with 2-0 silk ties. The thoracodorsal nerve was then identified deep to the ligated vein and preserved. The long thoracic nerve was then identified along the edge of the latissimus dorsi on the chest wall and preserved. The remaining nodal tissue between these nerves was then carefully removed, taking care to protect the nerves. The specimen, consisting of breast and attached nodal tissue, was then excised by dividing the remaining lateral pedical and sent to pathology.  The wound was irrigated and hemostasis was achieved.  A closed suction drains was brought into the operative field through a separate stab incision and sutured to the skin with a 3-0 nylon suture. The wound was closed with interrupted 3-0 Vicryl to the subcutaneous layer, followed by a subcuticular layer of Monocryl 3-0. The wound was dressed.  The patient tolerated the procedure well and was taken to the postanesthesia care unit in stable condition.   Specimen: Right Modified Radical Mastectomy  Complications: None  Estimated Blood Loss: 50 mL

## 2024-06-03 NOTE — Anesthesia Procedure Notes (Signed)
 Procedure Name: LMA Insertion Date/Time: 06/03/2024 1:47 PM  Performed by: Tramaine Sauls, CRNAPre-anesthesia Checklist: Patient identified, Emergency Drugs available, Suction available and Patient being monitored Patient Re-evaluated:Patient Re-evaluated prior to induction Oxygen Delivery Method: Circle System Utilized Preoxygenation: Pre-oxygenation with 100% oxygen Induction Type: IV induction Ventilation: Mask ventilation without difficulty LMA: LMA inserted LMA Size: 3.0 Number of attempts: 1 Airway Equipment and Method: Bite block Placement Confirmation: positive ETCO2 Tube secured with: Tape Dental Injury: Teeth and Oropharynx as per pre-operative assessment

## 2024-06-03 NOTE — Interval H&P Note (Signed)
 History and Physical Interval Note:  06/03/2024 1:26 PM  Martha Munoz  has presented today for surgery, with the diagnosis of malignant neoplasm of overlapping sites of rt breast in female, estrogen receptor positive.  The various methods of treatment have been discussed with the patient and family. After consideration of risks, benefits and other options for treatment, the patient has consented to  Procedure(s): MASTECTOMY, MODIFIED RADICAL (Right) as a surgical intervention.  The patient's history has been reviewed, patient examined, no change in status, stable for surgery.  I have reviewed the patient's chart and labs.  Questions were answered to the patient's satisfaction.     Lucas Sjogren

## 2024-06-03 NOTE — Anesthesia Preprocedure Evaluation (Signed)
 Anesthesia Evaluation  Patient identified by MRN, date of birth, ID band Patient awake    Reviewed: Allergy & Precautions, H&P , NPO status , Patient's Chart, lab work & pertinent test results, reviewed documented beta blocker date and time   History of Anesthesia Complications Negative for: history of anesthetic complications  Airway Mallampati: III  TM Distance: >3 FB Neck ROM: full    Dental  (+) Dental Advidsory Given, Caps, Teeth Intact   Pulmonary neg shortness of breath, sleep apnea , neg COPD, neg recent URI, former smoker   Pulmonary exam normal breath sounds clear to auscultation       Cardiovascular Exercise Tolerance: Good hypertension, (-) angina (-) Past MI and (-) Cardiac Stents negative cardio ROS Normal cardiovascular exam(-) dysrhythmias (-) Valvular Problems/Murmurs Rhythm:regular Rate:Normal     Neuro/Psych  PSYCHIATRIC DISORDERS  Depression    negative neurological ROS     GI/Hepatic Neg liver ROS,GERD  ,,  Endo/Other  negative endocrine ROS    Renal/GU negative Renal ROS  negative genitourinary   Musculoskeletal   Abdominal   Peds  Hematology negative hematology ROS (+)   Anesthesia Other Findings Past Medical History: No date: Adnexal mass No date: Anemia No date: Breast cancer in female [facility]) No date: Colon cancer (HCC) No date: Depression No date: GERD (gastroesophageal reflux disease) No date: Hyperlipidemia No date: Hypertension No date: Skin cancer No date: Sleep apnea     Comment:  not compliant with cpap   Reproductive/Obstetrics negative OB ROS                              Anesthesia Physical Anesthesia Plan  ASA: 3  Anesthesia Plan: General   Post-op Pain Management:    Induction: Intravenous  PONV Risk Score and Plan: 3 and Ondansetron, Dexamethasone and Treatment may vary due to age or medical condition  Airway Management Planned:  LMA  Additional Equipment:   Intra-op Plan:   Post-operative Plan: Extubation in OR  Informed Consent: I have reviewed the patients History and Physical, chart, labs and discussed the procedure including the risks, benefits and alternatives for the proposed anesthesia with the patient or authorized representative who has indicated his/her understanding and acceptance.     Dental Advisory Given  Plan Discussed with: Anesthesiologist, CRNA and Surgeon  Anesthesia Plan Comments:         Anesthesia Quick Evaluation

## 2024-06-03 NOTE — Transfer of Care (Signed)
 Immediate Anesthesia Transfer of Care Note  Patient: Martha  Jenkins Munoz  Procedure(s) Performed: MASTECTOMY, MODIFIED RADICAL (Right: Breast)  Patient Location: PACU  Anesthesia Type:General  Level of Consciousness: awake, alert , oriented, and patient cooperative  Airway & Oxygen Therapy: Patient Spontanous Breathing and Patient connected to face mask oxygen  Post-op Assessment: Report given to RN, Post -op Vital signs reviewed and stable, and Patient moving all extremities X 4  Post vital signs: Reviewed and stable  Last Vitals:  Vitals Value Taken Time  BP 165/73 06/03/24 15:58  Temp    Pulse 96 06/03/24 15:58  Resp 16 06/03/24 15:58  SpO2 89 % 06/03/24 15:58  Vitals shown include unfiled device data.  Last Pain:  Vitals:   06/03/24 1605  PainSc: 8          Complications: No notable events documented.

## 2024-06-04 ENCOUNTER — Encounter: Payer: Self-pay | Admitting: General Surgery

## 2024-06-04 ENCOUNTER — Observation Stay

## 2024-06-04 DIAGNOSIS — Z85038 Personal history of other malignant neoplasm of large intestine: Secondary | ICD-10-CM | POA: Diagnosis not present

## 2024-06-04 DIAGNOSIS — C50811 Malignant neoplasm of overlapping sites of right female breast: Secondary | ICD-10-CM | POA: Diagnosis not present

## 2024-06-04 DIAGNOSIS — Z87891 Personal history of nicotine dependence: Secondary | ICD-10-CM | POA: Diagnosis not present

## 2024-06-04 DIAGNOSIS — Z170 Estrogen receptor positive status [ER+]: Secondary | ICD-10-CM | POA: Diagnosis not present

## 2024-06-04 DIAGNOSIS — Z79899 Other long term (current) drug therapy: Secondary | ICD-10-CM | POA: Diagnosis not present

## 2024-06-04 DIAGNOSIS — C773 Secondary and unspecified malignant neoplasm of axilla and upper limb lymph nodes: Secondary | ICD-10-CM | POA: Diagnosis not present

## 2024-06-04 DIAGNOSIS — I10 Essential (primary) hypertension: Secondary | ICD-10-CM | POA: Diagnosis not present

## 2024-06-04 LAB — BASIC METABOLIC PANEL WITH GFR
Anion gap: 7 (ref 5–15)
BUN: 26 mg/dL — ABNORMAL HIGH (ref 8–23)
CO2: 25 mmol/L (ref 22–32)
Calcium: 8.6 mg/dL — ABNORMAL LOW (ref 8.9–10.3)
Chloride: 102 mmol/L (ref 98–111)
Creatinine, Ser: 0.89 mg/dL (ref 0.44–1.00)
GFR, Estimated: >60 mL/min (ref >60)
Glucose, Bld: 133 mg/dL — ABNORMAL HIGH (ref 70–99)
Potassium: 4.4 mmol/L (ref 3.5–5.1)
Sodium: 134 mmol/L — ABNORMAL LOW (ref 135–145)

## 2024-06-04 LAB — CBC
HCT: 35.2 % — ABNORMAL LOW (ref 36.0–46.0)
Hemoglobin: 11.8 g/dL — ABNORMAL LOW (ref 12.0–15.0)
MCH: 31.7 pg (ref 26.0–34.0)
MCHC: 33.5 g/dL (ref 30.0–36.0)
MCV: 94.6 fL (ref 80.0–100.0)
Platelets: 242 K/uL (ref 150–400)
RBC: 3.72 MIL/uL — ABNORMAL LOW (ref 3.87–5.11)
RDW: 12.3 % (ref 11.5–15.5)
WBC: 9.4 K/uL (ref 4.0–10.5)
nRBC: 0 % (ref 0.0–0.2)

## 2024-06-04 MED ORDER — IBUPROFEN 800 MG PO TABS: 800.0000 mg | ORAL_TABLET | Freq: Three times a day (TID) | ORAL | 0 refills | Status: AC | PRN

## 2024-06-04 MED ORDER — HYDROCODONE-ACETAMINOPHEN 5-325 MG PO TABS: 1.0000 | ORAL_TABLET | Freq: Three times a day (TID) | ORAL | 0 refills | Status: AC | PRN

## 2024-06-04 NOTE — Progress Notes (Signed)
 Pt being discharged, drain education complete, dressing supplies supplied, IV removed, instructed pt if [facility] agency does not call pt/ next of kin about [facility] services to call the surgery office for coordination.    Laneta JAYSON Rao, RN 06/04/2024 10:49 AM

## 2024-06-04 NOTE — Plan of Care (Signed)

## 2024-06-04 NOTE — Discharge Summary (Signed)
 [facility]-General Surgery  SURGICAL DISCHARGE SUMMARY  Patient ID: Martha Munoz  Stephaie Dardis MRN: [id number] DOB/AGE: Feb 06, 1936 88 y.o.  Admit date: 06/03/2024 Discharge date: 06/04/2024  Discharge Diagnoses Patient Active Problem List   Diagnosis Date Noted   Breast cancer (HCC) 06/03/2024   Malignant neoplasm of overlapping sites of right breast in female, estrogen receptor positive (HCC) 05/09/2024    Consultants None   Procedures Right modified radical mastectomy   Hospital Course:  Patient had advanced right breast cancer, characterized as multicentric invasive carcinoma, with lymph node metastasis on the right axillary lymph node.  Patient was scheduled for a right modified radical mastectomy on 06/03/2024. Surgery went well. Patient tolerated procedure.  Patient is now tolerating diet post-op. Surgical incision is clean and dry. No drainage. No signs of fluid collection noted. Patient is denying any pain or discomfort. Patient is clear from surgical standpoint.  Patient will follow-up outpatient with Dr. Cesar in 1 week.    Physical Examination:  Constitutional: alert, in no acute distress Pulmonary: CTA bilaterally, normal breath sounds Cardiac: regular rate and rhythm Skin: surgical incision looks clean, dry, and intact with surgical glue in place, no fluid collection    Allergies as of 06/04/2024   No Known Allergies      Medication List     TAKE these medications    diazepam  5 MG tablet Commonly known as: VALIUM  Take 1 tablet (5 mg total) by mouth every 6 (six) hours as needed. Take one tablet (5 mg total) by mouth before MRI's as needed.   HYDROcodone-acetaminophen 5-325 MG tablet Commonly known as: NORCO/VICODIN Take 1 tablet by mouth every 8 (eight) hours as needed for up to 3 days.   ibuprofen 800 MG tablet Commonly known as: ADVIL Take 1 tablet (800 mg total) by mouth every 8 (eight) hours as needed for mild pain (pain score 1-3) or moderate  pain (pain score 4-6).   letrozole  2.5 MG tablet Commonly known as: FEMARA  Take 1 tablet (2.5 mg total) by mouth daily. What changed: when to take this   losartan 100 MG tablet Commonly known as: COZAAR Take 25 mg by mouth at bedtime.   metoprolol succinate 25 MG 24 hr tablet Commonly known as: TOPROL-XL Take 25 mg by mouth at bedtime.   simvastatin 20 MG tablet Commonly known as: ZOCOR Take 20 mg by mouth daily at 6 PM.          Follow-up Information     Rodolph Romano, MD Follow up on 06/11/2024.   Specialty: General Surgery Why: 1 week f/u post-op right mastectomy with drain. Go at 9:45am Contact information: [address] [place] [address] [phone]                  Time spent on discharge management including discussion of hospital course, clinical condition, outpatient instructions, prescriptions, and follow up with the patient and members of the medical team: >30 minutes  Champ Keetch Barrientos PA-C

## 2024-06-04 NOTE — Plan of Care (Signed)

## 2024-06-04 NOTE — TOC CM/SW Note (Signed)
 Transition of Care [facility]) - Inpatient Brief Assessment   Patient Details  Name: Martha  Junell Munoz MRN: [id number] Date of Birth: [birth year]  Transition of [facility]) CM/SW Contact:    Daved JONETTA Hamilton, RN Phone Number: 06/04/2024, 9:33 AM   Clinical Narrative:   Transition of Care (TOC) Screening Note   Patient Details  Name: Martha  Jenkins Munoz Date of Birth: [birth year]   Transition of Care [facility]) CM/SW Contact:    Daved JONETTA Hamilton, RN Phone Number: 06/04/2024, 9:34 AM    Transition of Care Department [facility]) has reviewed patient and no TOC needs have been identified at this time. If new patient transition needs arise, please place a TOC consult.   Bedside nurse and MD notified that patient will require drain education before discharge.   Transition of Care Asessment: Insurance and Status: Insurance coverage has been reviewed Patient has primary care physician: Yes     Prior/Current Home Services: No current home services Social Drivers of Health Review: SDOH reviewed no interventions necessary Readmission risk has been reviewed: No (patient in observation status, no score generated) Transition of care needs: no transition of care needs at this time

## 2024-06-04 NOTE — Discharge Instructions (Signed)
  Diet: Resume home heart healthy regular diet.   Activity: No heavy lifting >20 pounds (children, pets, laundry, garbage) or strenuous activity until follow-up, but light activity and walking are encouraged. Do not drive or drink alcohol if taking narcotic pain medications.  Wound care: May shower with soapy water and pat dry (do not rub incision), but no baths or submerging incision underwater until follow-up. (no swimming) Continue with daily dressing changes near drain site with gauze and paper tape. Keep area dry.  If patient prefers to not do daily dressing changes, keep area cover and dry at all times.  Keep breast binder in place. Continue to empty drain daily and record output. Monitor for any changes.   Medications: Resume all home medications. For mild to moderate pain: acetaminophen (Tylenol) or ibuprofen (if no kidney disease). Combining Tylenol with alcohol can substantially increase your risk of causing liver disease. Narcotic pain medications, if prescribed, can be used for severe pain, though may cause nausea, constipation, and drowsiness. Do not combine Tylenol and Norco within a 6 hour period as Norco contains Tylenol. If you do not need the narcotic pain medication, you do not need to fill the prescription.  Call office [phone]) at any time if any questions, worsening pain, fevers/chills, bleeding, drainage from incision site, or other concerns.

## 2024-06-04 NOTE — Progress Notes (Signed)
 Mobility Specialist - Progress Note  Post-mobility: HR 60, SPO2 96%   06/04/24 1044  Mobility  Activity Ambulated independently  Level of Assistance Modified independent, requires aide device or extra time  Assistive Device None  Distance Ambulated (ft) 160 ft  Activity Response Tolerated well  Mobility visit 1 Mobility  Mobility Specialist Start Time (ACUTE ONLY) 1025  Mobility Specialist Stop Time (ACUTE ONLY) 1032  Mobility Specialist Time Calculation (min) (ACUTE ONLY) 7 min   Pt standing near the window upon arrival, utilizing RA. Pt amb one lap around the NS ModI-- expressed no pain, however felt SOB upon return to the room. O2 87%, rising to 94% within one min of PBL. Pt left seated EOB with needs within reach. Family present at bedside.  America Silvan Mobility Specialist 06/04/24 11:11 AM

## 2024-06-05 LAB — SURGICAL PATHOLOGY

## 2024-06-07 DIAGNOSIS — I10 Essential (primary) hypertension: Secondary | ICD-10-CM | POA: Diagnosis not present

## 2024-06-07 DIAGNOSIS — L7634 Postprocedural seroma of skin and subcutaneous tissue following other procedure: Secondary | ICD-10-CM | POA: Diagnosis not present

## 2024-06-07 DIAGNOSIS — C50811 Malignant neoplasm of overlapping sites of right female breast: Secondary | ICD-10-CM | POA: Diagnosis not present

## 2024-06-07 DIAGNOSIS — D630 Anemia in neoplastic disease: Secondary | ICD-10-CM | POA: Diagnosis not present

## 2024-06-07 DIAGNOSIS — C773 Secondary and unspecified malignant neoplasm of axilla and upper limb lymph nodes: Secondary | ICD-10-CM | POA: Diagnosis not present

## 2024-06-07 DIAGNOSIS — F0393 Unspecified dementia, unspecified severity, with mood disturbance: Secondary | ICD-10-CM | POA: Diagnosis not present

## 2024-06-07 DIAGNOSIS — I6523 Occlusion and stenosis of bilateral carotid arteries: Secondary | ICD-10-CM | POA: Diagnosis not present

## 2024-06-07 DIAGNOSIS — Z170 Estrogen receptor positive status [ER+]: Secondary | ICD-10-CM | POA: Diagnosis not present

## 2024-06-07 DIAGNOSIS — F32A Depression, unspecified: Secondary | ICD-10-CM | POA: Diagnosis not present

## 2024-06-08 NOTE — Anesthesia Postprocedure Evaluation (Signed)
 Anesthesia Post Note  Patient: Martha  Jenkins Munoz  Procedure(s) Performed: MASTECTOMY, MODIFIED RADICAL (Right: Breast)  Patient location during evaluation: PACU Anesthesia Type: General Level of consciousness: awake and alert Pain management: pain level controlled Vital Signs Assessment: post-procedure vital signs reviewed and stable Respiratory status: spontaneous breathing, nonlabored ventilation, respiratory function stable and patient connected to nasal cannula oxygen Cardiovascular status: blood pressure returned to baseline and stable Postop Assessment: no apparent nausea or vomiting Anesthetic complications: no   No notable events documented.   Last Vitals:  Vitals:   06/04/24 0355 06/04/24 0904  BP: 129/62 (!) 135/52  Pulse: 70 71  Resp: 16 16  Temp: 36.6 C 36.8 C  SpO2: 100% 98%    Last Pain:  Vitals:   06/04/24 0958  TempSrc:   PainSc: 0-No pain                 Prentice Murphy

## 2024-06-12 DIAGNOSIS — I6523 Occlusion and stenosis of bilateral carotid arteries: Secondary | ICD-10-CM | POA: Diagnosis not present

## 2024-06-12 DIAGNOSIS — F0393 Unspecified dementia, unspecified severity, with mood disturbance: Secondary | ICD-10-CM | POA: Diagnosis not present

## 2024-06-12 DIAGNOSIS — C773 Secondary and unspecified malignant neoplasm of axilla and upper limb lymph nodes: Secondary | ICD-10-CM | POA: Diagnosis not present

## 2024-06-12 DIAGNOSIS — F32A Depression, unspecified: Secondary | ICD-10-CM | POA: Diagnosis not present

## 2024-06-12 DIAGNOSIS — I10 Essential (primary) hypertension: Secondary | ICD-10-CM | POA: Diagnosis not present

## 2024-06-12 DIAGNOSIS — L7634 Postprocedural seroma of skin and subcutaneous tissue following other procedure: Secondary | ICD-10-CM | POA: Diagnosis not present

## 2024-06-12 DIAGNOSIS — Z170 Estrogen receptor positive status [ER+]: Secondary | ICD-10-CM | POA: Diagnosis not present

## 2024-06-12 DIAGNOSIS — C50811 Malignant neoplasm of overlapping sites of right female breast: Secondary | ICD-10-CM | POA: Diagnosis not present

## 2024-06-12 NOTE — Progress Notes (Signed)
 Will address when I see her on 10/31

## 2024-06-12 NOTE — Telephone Encounter (Signed)
 Patient is having much more memory issues.  Martha Munoz suggested is the letrozole  making it worse. Martha also called the POA and they definitely said that she is getting worse and worse with her memory.  [name] and the POA would like to know what to do about the memory being worse and being on the letrozole  is it make memory worse or not.

## 2024-06-13 ENCOUNTER — Ambulatory Visit: Admitting: Occupational Therapy

## 2024-06-14 DIAGNOSIS — I6523 Occlusion and stenosis of bilateral carotid arteries: Secondary | ICD-10-CM | POA: Diagnosis not present

## 2024-06-14 DIAGNOSIS — C773 Secondary and unspecified malignant neoplasm of axilla and upper limb lymph nodes: Secondary | ICD-10-CM | POA: Diagnosis not present

## 2024-06-14 DIAGNOSIS — L7634 Postprocedural seroma of skin and subcutaneous tissue following other procedure: Secondary | ICD-10-CM | POA: Diagnosis not present

## 2024-06-14 DIAGNOSIS — C50811 Malignant neoplasm of overlapping sites of right female breast: Secondary | ICD-10-CM | POA: Diagnosis not present

## 2024-06-14 DIAGNOSIS — I10 Essential (primary) hypertension: Secondary | ICD-10-CM | POA: Diagnosis not present

## 2024-06-14 DIAGNOSIS — F32A Depression, unspecified: Secondary | ICD-10-CM | POA: Diagnosis not present

## 2024-06-14 DIAGNOSIS — Z170 Estrogen receptor positive status [ER+]: Secondary | ICD-10-CM | POA: Diagnosis not present

## 2024-06-14 DIAGNOSIS — D630 Anemia in neoplastic disease: Secondary | ICD-10-CM | POA: Diagnosis not present

## 2024-06-14 DIAGNOSIS — F0393 Unspecified dementia, unspecified severity, with mood disturbance: Secondary | ICD-10-CM | POA: Diagnosis not present

## 2024-06-19 DIAGNOSIS — D630 Anemia in neoplastic disease: Secondary | ICD-10-CM | POA: Diagnosis not present

## 2024-06-19 DIAGNOSIS — I10 Essential (primary) hypertension: Secondary | ICD-10-CM | POA: Diagnosis not present

## 2024-06-19 DIAGNOSIS — C50811 Malignant neoplasm of overlapping sites of right female breast: Secondary | ICD-10-CM | POA: Diagnosis not present

## 2024-06-19 DIAGNOSIS — F32A Depression, unspecified: Secondary | ICD-10-CM | POA: Diagnosis not present

## 2024-06-19 DIAGNOSIS — I6523 Occlusion and stenosis of bilateral carotid arteries: Secondary | ICD-10-CM | POA: Diagnosis not present

## 2024-06-19 DIAGNOSIS — C773 Secondary and unspecified malignant neoplasm of axilla and upper limb lymph nodes: Secondary | ICD-10-CM | POA: Diagnosis not present

## 2024-06-19 DIAGNOSIS — Z170 Estrogen receptor positive status [ER+]: Secondary | ICD-10-CM | POA: Diagnosis not present

## 2024-06-19 DIAGNOSIS — L7634 Postprocedural seroma of skin and subcutaneous tissue following other procedure: Secondary | ICD-10-CM | POA: Diagnosis not present

## 2024-06-19 DIAGNOSIS — F0393 Unspecified dementia, unspecified severity, with mood disturbance: Secondary | ICD-10-CM | POA: Diagnosis not present

## 2024-06-19 NOTE — Telephone Encounter (Signed)
 Per Dr. Melanee Will address when I see her on 10/31.  Left voice message.

## 2024-06-21 ENCOUNTER — Inpatient Hospital Stay: Admitting: Oncology

## 2024-06-21 ENCOUNTER — Encounter: Payer: Self-pay | Admitting: Oncology

## 2024-06-21 ENCOUNTER — Other Ambulatory Visit: Payer: Self-pay

## 2024-06-21 VITALS — BP 135/65 | HR 65 | Temp 96.8°F | Resp 19 | Ht 61.0 in | Wt 122.0 lb

## 2024-06-21 DIAGNOSIS — Z7189 Other specified counseling: Secondary | ICD-10-CM

## 2024-06-21 DIAGNOSIS — C773 Secondary and unspecified malignant neoplasm of axilla and upper limb lymph nodes: Secondary | ICD-10-CM | POA: Diagnosis not present

## 2024-06-21 DIAGNOSIS — Z170 Estrogen receptor positive status [ER+]: Secondary | ICD-10-CM

## 2024-06-21 DIAGNOSIS — C50811 Malignant neoplasm of overlapping sites of right female breast: Secondary | ICD-10-CM | POA: Diagnosis not present

## 2024-06-21 DIAGNOSIS — Z79811 Long term (current) use of aromatase inhibitors: Secondary | ICD-10-CM | POA: Diagnosis not present

## 2024-06-21 NOTE — Progress Notes (Signed)
 Patient is having some dizziness that comes and goes. She is also having some memory issues that seems to be progressing.

## 2024-06-21 NOTE — Progress Notes (Signed)
 Hematology/Oncology Consult note [facility]  Telephone:([phone] [phone]  Patient Care Team: Sadie Manna, MD as PCP - General (Internal Medicine) Georgina Shasta POUR, RN as Oncology Nurse Navigator   Name of the patient: Martha Munoz  [id number]  Nov 29, 1935   Date of visit: 06/21/24  Diagnosis-  Cancer Staging  Malignant neoplasm of overlapping sites of right breast in female, estrogen receptor positive (HCC) Staging form: Breast, AJCC 8th Edition - Clinical: Stage IIIA (cT3, cN1, cM0, G2, ER+, PR-, HER2-) - Signed by Melanee Annah BROCKS, MD on 05/09/2024 Histologic grading system: 3 grade system - Pathologic stage from 06/21/2024: Stage IIIA (pT3, pN1a, cM0, G2, ER+, PR-, HER2-) - Signed by Melanee Annah BROCKS, MD on 06/21/2024 Stage prefix: Initial diagnosis Multigene prognostic tests performed: None Histologic grading system: 3 grade system    Chief complaint/ Reason for visit-discuss pathology results and further management  Heme/Onc history: patient is a 88 year old female who found right breast mass over 6 months ago.  She underwent a diagnostic mammogram in August 2025 after she mentioned it to her primary care doctor.   Mammogram and ultrasound showed multiple contiguous appearing masses throughout the right upper outer and lower outer quadrants extending into the posterior retroareolar.  Spanning at least 5.6 cm.  Representative conglomeration of masses documented at 7:00 4 cm from the nipple which measures 4.6 x 2.7 x 4.9 cm.This extends and it appears contiguous with the retroareolar mass at 9 o'clock. An additional representative cluster of masses is documented at 10:30 8 cm from the nipple which measures 3.3 x 1.4 by 1.7 cm.  Single mildly enlarged right axillary lymph node   Patient underwent breast biopsy from right upper quadrant and right lower quadrant as well as axillary lymph node biopsy.  This was consistent with invasive mammary  carcinoma grade 2.  Immunohistochemical stain shows loss of E-cadherin expression consistent with lobular phenotype.  ER 100% positive PR negative Ki-67 5%.  Tumor cells were equivocal for HER2 and HER2 FISH testing was pending at the time of my visit   Patient is here with her niece today.She is independent of her ADLs.  No known family history of breast cancer.  Interval history- Discussed the use of AI scribe software for clinical note transcription with the patient, who gave verbal consent to proceed.  Martha Munoz  Emerly Prak is an 88 year old female with stage III breast cancer who presents for discussion of surgery results and treatment options.  She is currently taking letrozole , an estrogen blocker, to manage her hormone-driven breast cancer. Her cancer is estrogen-driven due to peripheral fat tissue producing estrogen, despite her age.  She lives alone and values her independence, expressing concerns about the potential side effects of chemotherapy and radiation therapy, such as nausea, vomiting, risk of infection, neuropathy, and the daily commitment required for radiation therapy. She has decided against chemotherapy and is hesitant about radiation therapy due to the daily schedule and potential for lymphedema.  ECOG PS- 2 Pain scale- 0   Review of systems- Review of Systems  Constitutional:  Negative for chills, fever, malaise/fatigue and weight loss.  HENT:  Negative for congestion, ear discharge and nosebleeds.   Eyes:  Negative for blurred vision.  Respiratory:  Negative for cough, hemoptysis, sputum production, shortness of breath and wheezing.   Cardiovascular:  Negative for chest pain, palpitations, orthopnea and claudication.  Gastrointestinal:  Negative for abdominal pain, blood in stool, constipation, diarrhea, heartburn, melena, nausea and vomiting.  Genitourinary:  Negative for dysuria, flank pain, frequency, hematuria and urgency.  Musculoskeletal:  Negative for back pain,  joint pain and myalgias.  Skin:  Negative for rash.  Neurological:  Negative for dizziness, tingling, focal weakness, seizures, weakness and headaches.  Endo/Heme/Allergies:  Does not bruise/bleed easily.  Psychiatric/Behavioral:  Negative for depression and suicidal ideas. The patient does not have insomnia.       No Known Allergies   Past Medical History:  Diagnosis Date   Adnexal mass    Anemia    Breast cancer in female [facility])    Colon cancer (HCC)    Depression    GERD (gastroesophageal reflux disease)    Hyperlipidemia    Hypertension    Skin cancer    Sleep apnea    not compliant with cpap     Past Surgical History:  Procedure Laterality Date   BREAST BIOPSY Right 90s   benign   BREAST BIOPSY Right 05/01/2024   US  RT BREAST BX W LOC DEV EA ADD LESION IMG BX SPEC US  GUIDE 05/01/2024 [facility]   BREAST BIOPSY Right 05/01/2024   US  RT BREAST BX W LOC DEV 1ST LESION IMG BX SPEC US  GUIDE 05/01/2024 [facility]   COLON RESECTION  1995   COLONOSCOPY     MASTECTOMY MODIFIED RADICAL Right 06/03/2024   Procedure: MASTECTOMY, MODIFIED RADICAL;  Surgeon: Rodolph Romano, MD;  Location: [facility];  Service: General;  Laterality: Right;    Social History   Socioeconomic History   Marital status: Single    Spouse name: Not on file   Number of children: Not on file   Years of education: Not on file   Highest education level: Not on file  Occupational History   Not on file  Tobacco Use   Smoking status: Former    Types: Cigarettes   Smokeless tobacco: Not on file  Vaping Use   Vaping status: Never Used  Substance and Sexual Activity   Alcohol use: Yes    Comment: martinia daily   Drug use: No   Sexual activity: Not on file  Other Topics Concern   Not on file  Social History Narrative   Not on file   Social Drivers of Health   Financial Resource Strain: Low Risk  (05/08/2024)   Overall Financial Resource Strain (CARDIA)    Difficulty of Paying  Living Expenses: Not hard at all  Food Insecurity: No Food Insecurity (06/04/2024)   Hunger Vital Sign    Worried About Radiation Protection Practitioner of Food in the Last Year: Never true    Ran Out of Food in the Last Year: Never true  Transportation Needs: No Transportation Needs (06/04/2024)   PRAPARE - Administrator, Civil Service (Medical): No    Lack of Transportation (Non-Medical): No  Physical Activity: Inactive (05/08/2024)   Exercise Vital Sign    Days of Exercise per Week: 0 days    Minutes of Exercise per Session: 0 min  Stress: No Stress Concern Present (05/08/2024)   [employer] of Occupational Health - Occupational Stress Questionnaire    Feeling of Stress: Not at all  Social Connections: Socially Integrated (06/04/2024)   Social Connection and Isolation Panel    Frequency of Communication with Friends and Family: More than three times a week    Frequency of Social Gatherings with Friends and Family: More than three times a week    Attends Religious Services: More than 4 times per year    Active Member of  Clubs or Organizations: No    Attends Engineer, Structural: More than 4 times per year    Marital Status: Married  Catering Manager Violence: Not At Risk (06/04/2024)   Humiliation, Afraid, Rape, and Kick questionnaire    Fear of Current or Ex-Partner: No    Emotionally Abused: No    Physically Abused: No    Sexually Abused: No    Family History  Problem Relation Age of Onset   CVA Mother    Heart attack Father      Current Outpatient Medications:    diazepam  (VALIUM ) 5 MG tablet, Take 1 tablet (5 mg total) by mouth every 6 (six) hours as needed. Take one tablet (5 mg total) by mouth before MRI's as needed., Disp: 30 tablet, Rfl: 0   ibuprofen (ADVIL) 800 MG tablet, Take 1 tablet (800 mg total) by mouth every 8 (eight) hours as needed for mild pain (pain score 1-3) or moderate pain (pain score 4-6)., Disp: 30 tablet, Rfl: 0   letrozole  (FEMARA ) 2.5 MG  tablet, Take 1 tablet (2.5 mg total) by mouth daily. (Patient taking differently: Take 2.5 mg by mouth at bedtime.), Disp: 30 tablet, Rfl: 3   losartan (COZAAR) 100 MG tablet, Take 25 mg by mouth at bedtime., Disp: , Rfl:    metoprolol succinate (TOPROL-XL) 25 MG 24 hr tablet, Take 25 mg by mouth at bedtime., Disp: , Rfl:    simvastatin (ZOCOR) 20 MG tablet, Take 20 mg by mouth daily at 6 PM., Disp: , Rfl:   Physical exam:  Vitals:   06/21/24 0951  BP: 135/65  Pulse: 65  Resp: 19  Temp: (!) 96.8 F (36 C)  TempSrc: Tympanic  SpO2: 99%  Weight: 122 lb (55.3 kg)  Height: 5' 1 (1.549 m)   Physical Exam Cardiovascular:     Rate and Rhythm: Normal rate and regular rhythm.     Heart sounds: Normal heart sounds.  Pulmonary:     Effort: Pulmonary effort is normal.     Breath sounds: Normal breath sounds.  Skin:    General: Skin is warm and dry.  Neurological:     Mental Status: She is alert and oriented to person, place, and time.   Breast exam: Patient is s/p right mastectomy without reconstruction.  Mastectomy scar is healing well.  I have personally reviewed labs listed below:    Latest Ref Rng & Units 06/04/2024    3:58 AM  CMP  Glucose 70 - 99 mg/dL 866   BUN 8 - 23 mg/dL 26   Creatinine 9.55 - 1.00 mg/dL 9.10   Sodium 864 - 854 mmol/L 134   Potassium 3.5 - 5.1 mmol/L 4.4   Chloride 98 - 111 mmol/L 102   CO2 22 - 32 mmol/L 25   Calcium 8.9 - 10.3 mg/dL 8.6       Latest Ref Rng & Units 06/04/2024    3:58 AM  CBC  WBC 4.0 - 10.5 K/uL 9.4   Hemoglobin 12.0 - 15.0 g/dL 88.1   Hematocrit 63.9 - 46.0 % 35.2   Platelets 150 - 400 K/uL 242    I have personally reviewed Radiology images listed below: No images are attached to the encounter.  DG BREAST SURGICAL SPECIMEN NO CHARGE Result Date: 06/04/2024 This procedure is a no report and no charge.  It will auto finalize.  MR BREAST BILATERAL W WO CONTRAST INC CAD Result Date: 05/23/2024 CLINICAL DATA:  Recently  diagnosed multicentric RIGHT breast cancer  and biopsy-proven RIGHT axillary nodal metastasis EXAM: BILATERAL BREAST MRI WITH AND WITHOUT CONTRAST TECHNIQUE: Multiplanar, multisequence MR images of both breasts were obtained prior to and following the intravenous administration of 5 ml of Gadavist Three-dimensional MR images were rendered by post-processing of the original MR data on an independent workstation. The three-dimensional MR images were interpreted, and findings are reported in the following complete MRI report for this study. Three dimensional images were evaluated at the independent interpreting workstation using the DynaCAD thin client. COMPARISON:  Previous exam(s). FINDINGS: Breast composition: d. Extreme fibroglandular tissue. Background parenchymal enhancement: Mild Right breast: Abnormal irregular masses and non-mass enhancement involving essentially the entire RIGHT breast, spanning 7.1 x 8.6 x 6.8 cm. The abnormal enhancement directly invades the RIGHT nipple and LATERAL peri areolar skin anteriorly (series 14 image 38). There is asymmetric decrease in size of the RIGHT breast, diffuse edema throughout the RIGHT breast, and RIGHT breast skin thickening. No evidence of chest wall invasion. Left breast: No MRI evidence of malignancy. Lymph nodes: Enlarged RIGHT axillary lymph nodes (for example, series 6 image 67 and image 90). No LEFT axillary lymphadenopathy. Ancillary findings:  None IMPRESSION: 1. Irregular masses and non mass enhancement spanning nearly 9 cm and occupying essentially the entirety of the RIGHT breast, compatible with known multicentric RIGHT breast cancer. There is direct nipple and skin invasion anteriorly, with diffusely decreased size of the RIGHT breast, skin thickening and edema concerning for possible inflammatory breast cancer. Skin punch biopsy could be considered if it would change clinical management. No MRI evidence of chest wall involvement. 2. Mild RIGHT axillary  lymphadenopathy in keeping with known nodal metastases. 3.  No MRI evidence of malignancy in the contralateral LEFT breast. RECOMMENDATION: Continued management per surgery and oncology teams. RIGHT breast skin punch biopsy could be considered if results would change clinical management, as above. BI-RADS CATEGORY  6: Known biopsy-proven malignancy. Electronically Signed   By: Norleen Croak M.D.   On: 05/23/2024 13:48     Assessment and plan- Patient is a 88 y.o. female with history of pathological prognostic stage IIIa invasive lobular carcinoma of the right breast pT3 N1a M0 ER positive PR negative HER2 negative grade 2 s/p right mastectomy ER to discuss further management  Assessment and Plan    Stage III right breast cancer, ER-positive, HER2-negative, post-mastectomy Tumor 7.6 cm, one positive lymph node, negative margins. Chemotherapy typically recommended for tumors >5 cm, but she prefers to avoid due to side effects.  I am not opposed to offering her adjuvant chemotherapy for tumor size greater than 5 cm but the magnitude of benefit for a low Ki-67 lobular carcinoma at her age is unclear and does come with potential side effects including nausea vomiting low blood counts risk of infections hair loss and peripheral neuropathy if Taxotere and Cytoxan chemotherapy is considered IV for 4 cycles.  Patient and her niece understand the pros and cons of chemotherapy and would like to forego chemotherapy at this time  -I do recommend that patient should consider adjuvant radiation therapy although this can be potentially associated with increased risk of post mastectomy lymphedema.  Patient is hesitant to pursue radiation treatment at this time but is willing to speak to radiation oncology  Patient is presently on letrozole  for ER-positive disease and is tolerating it well and she will continue with that.  I will tentatively see her back in 2 months and if she has decided not to pursue radiation or has  completed radiation I  will discuss starting adjuvant CDK inhibitor Verzenio for 2 years with her at that time.  Treatment will be given with a curative intent  Seroma and pain post-mastectomy, right breast Seroma and pain post-mastectomy, requiring aspiration every four days. Upcoming surgical follow-up. - Continue aspiration as needed.  Increased risk of lymphedema, right upper extremity post-mastectomy Increased lymphedema risk due to lymph node dissection and potential radiation therapy. - Refer to occupational therapist for evaluation and management of lymphedema risk. - Consider compression sleeve and exercises as recommended by occupational therapist.       Cancer Staging  Malignant neoplasm of overlapping sites of right breast in female, estrogen receptor positive (HCC) Staging form: Breast, AJCC 8th Edition - Clinical: Stage IIIA (cT3, cN1, cM0, G2, ER+, PR-, HER2-) - Signed by Melanee Annah BROCKS, MD on 05/09/2024 Histologic grading system: 3 grade system - Pathologic stage from 06/21/2024: Stage IIIA (pT3, pN1a, cM0, G2, ER+, PR-, HER2-) - Signed by Melanee Annah BROCKS, MD on 06/21/2024 Stage prefix: Initial diagnosis Multigene prognostic tests performed: None Histologic grading system: 3 grade system     Visit Diagnosis 1. Malignant neoplasm of overlapping sites of right breast in female, estrogen receptor positive (HCC)   2. Goals of care, counseling/discussion      Dr. Annah Melanee, MD, MPH [facility] [phone] 06/21/2024 1:09 PM

## 2024-06-25 ENCOUNTER — Ambulatory Visit: Payer: Self-pay | Admitting: General Surgery

## 2024-06-25 MED ORDER — CEFAZOLIN SODIUM-DEXTROSE 2-4 GM/100ML-% IV SOLN
2.0000 g | INTRAVENOUS | Status: AC
  Administered 2024-06-26: 2 g via INTRAVENOUS

## 2024-06-25 NOTE — Progress Notes (Signed)
 History of Present Illness Patient come for follow-up of her right chest seroma.  She denies any new complaint.  She endorses persistent swelling of the right chest.  She denies any significant drainage.  She denies any chest pain.  She denies any bleeding.       PAST MEDICAL HISTORY:  Past Medical History:  Diagnosis Date  . Adnexal mass   . Anemia   . Back pain   . Cancer (CMS/HHS-HCC)    colon cancer  . Chest pain   . H/O exercise stress test 2009   negative  . Hyperlipidemia   . Hypertension   . Osteoarthritis   . Sleep apnea    mild        PAST SURGICAL HISTORY:   Past Surgical History:  Procedure Laterality Date  . COLONOSCOPY  09/24/2003   [facility] w/Colon Resection in 1995  . COLONOSCOPY  11/25/2003   [facility]  . COLONOSCOPY  01/15/2008   [facility]  . EGD  01/15/2008  . COLONOSCOPY  05/14/2012   [facility]: CBF 04/2017; Recall Ltr mailed 03/28/2017 (dw)  . EGD  03/03/2014   No repeat per RTE  . OOPHORECTOMY  09/22/2014   bilateral salpingectomy  . MASTECTOMY MODIFIED RADICAL Right 06/03/2024   Dr Lucas Catchings  . BREAST SURGERY    . nasal surgery    . PELVIC LAPAROSCOPY    . TONSILLECTOMY AND ADENOIDECTOMY           MEDICATIONS:  Outpatient Encounter Medications as of 06/25/2024  Medication Sig Dispense Refill  . letrozole  (FEMARA ) 2.5 mg tablet Take 2.5 mg by mouth once daily    . losartan (COZAAR) 25 MG tablet Take 1 tablet (25 mg total) by mouth once daily 90 tablet 1  . metoprolol SUCCinate (TOPROL-XL) 25 MG XL tablet TAKE 1 TABLET(25 MG) BY MOUTH EVERY DAY 90 tablet 1  . sertraline (ZOLOFT) 25 MG tablet Take 1 tablet (25 mg total) by mouth once daily 90 tablet 1  . simvastatin (ZOCOR) 20 MG tablet TAKE 1 TABLET(20 MG) BY MOUTH AT BEDTIME 90 tablet 1   No facility-administered encounter medications on file as of 06/25/2024.     ALLERGIES:   Patient has no known allergies.   SOCIAL HISTORY:  Social History   Socioeconomic History  . Marital status: Single   Tobacco Use  . Smoking status: Former    Current packs/day: 0.00    Average packs/day: 1.5 packs/day for 40.0 years (60.0 ttl pk-yrs)    Types: Cigarettes    Start date: 08/22/1953    Quit date: 08/22/1993    Years since quitting: 30.8  . Smokeless tobacco: Never  Vaping Use  . Vaping status: Never Used  Substance and Sexual Activity  . Alcohol use: Yes    Alcohol/week: 7.0 standard drinks of alcohol    Types: 7 Shots of liquor per week    Comment: Drinks 1 martini daily  . Drug use: No  . Sexual activity: Defer   Social Drivers of Health   Financial Resource Strain: Low Risk  (05/08/2024)   Received from [facility]   Overall Financial Resource Strain (CARDIA)   . How hard is it for you to pay for the very basics like food, housing, medical care, and heating?: Not hard at all  Food Insecurity: No Food Insecurity (06/04/2024)   Received from [facility]   Hunger Vital Sign   . Within the past 12 months, you worried that your food would run out before you  got the money to buy more.: Never true   . Within the past 12 months, the food you bought just didn't last and you didn't have money to get more.: Never true  Transportation Needs: No Transportation Needs (06/04/2024)   Received from [facility]   . In the past 12 months, has lack of transportation kept you from medical appointments or from getting medications?: No   . In the past 12 months, has lack of transportation kept you from meetings, work, or from getting things needed for daily living?: No    FAMILY HISTORY:  Family History  Problem Relation Name Age of Onset  . High blood pressure (Hypertension) Mother    . Stroke Mother    . Myocardial Infarction (Heart attack) Father    . Arthritis Brother       GENERAL REVIEW OF SYSTEMS:   General ROS: negative for - chills, fatigue, fever, weight gain or weight loss Allergy and Immunology ROS: negative for - hives  Hematological and Lymphatic  ROS: negative for - bleeding problems or bruising, negative for palpable nodes Endocrine ROS: negative for - heat or cold intolerance, hair changes Respiratory ROS: negative for - cough, shortness of breath or wheezing Cardiovascular ROS: no chest pain or palpitations GI ROS: negative for nausea, vomiting, abdominal pain, diarrhea, constipation Musculoskeletal ROS: negative for - joint swelling or muscle pain Neurological ROS: negative for - confusion, syncope Dermatological ROS: negative for pruritus and rash  PHYSICAL EXAM:  Vitals:   06/25/24 0908  BP: 125/66  Pulse: 62  .  Ht:154.9 cm (5' 1) Wt:54.9 kg (121 lb) ADJ:Anib surface area is 1.54 meters squared. Body mass index is 22.86 kg/m.SABRA   GENERAL: Alert, active, oriented x3  HEENT: Pupils equal reactive to light. Extraocular movements are intact. Sclera clear. Palpebral conjunctiva normal red color.Pharynx clear.  NECK: Supple with no palpable mass and no adenopathy.  LUNGS: Sound clear with no rales rhonchi or wheezes.  HEART: Regular rhythm S1 and S2 without murmur.  CHEST: Recurrent right chest seroma, no redness, no drainage  EXTREMITIES: Well-developed well-nourished symmetrical with no dependent edema.  NEUROLOGICAL: Awake alert oriented, facial expression symmetrical, moving all extremities.    Assessment & Plan Recurrent right chest seroma - Patient has had multiple drainage at bedside - Due to the size and failure of previous drainage I will recommend to take her to the operation room for formal deep drainage of the seroma - Discussed with family members and they agreed to proceed.   Seroma of breast [N64.89]          Patient and her niece verbalized understanding, all questions were answered, and were agreeable with the plan outlined above.   Lucas Sjogren, MD  Electronically signed by Lucas Sjogren, MD

## 2024-06-25 NOTE — H&P (View-Only) (Signed)
 History of Present Illness Patient come for follow-up of her right chest seroma.  She denies any new complaint.  She endorses persistent swelling of the right chest.  She denies any significant drainage.  She denies any chest pain.  She denies any bleeding.       PAST MEDICAL HISTORY:  Past Medical History:  Diagnosis Date  . Adnexal mass   . Anemia   . Back pain   . Cancer (CMS/HHS-HCC)    colon cancer  . Chest pain   . H/O exercise stress test 2009   negative  . Hyperlipidemia   . Hypertension   . Osteoarthritis   . Sleep apnea    mild        PAST SURGICAL HISTORY:   Past Surgical History:  Procedure Laterality Date  . COLONOSCOPY  09/24/2003   [facility] w/Colon Resection in 1995  . COLONOSCOPY  11/25/2003   [facility]  . COLONOSCOPY  01/15/2008   [facility]  . EGD  01/15/2008  . COLONOSCOPY  05/14/2012   [facility]: CBF 04/2017; Recall Ltr mailed 03/28/2017 (dw)  . EGD  03/03/2014   No repeat per RTE  . OOPHORECTOMY  09/22/2014   bilateral salpingectomy  . MASTECTOMY MODIFIED RADICAL Right 06/03/2024   Dr Lucas Catchings  . BREAST SURGERY    . nasal surgery    . PELVIC LAPAROSCOPY    . TONSILLECTOMY AND ADENOIDECTOMY           MEDICATIONS:  Outpatient Encounter Medications as of 06/25/2024  Medication Sig Dispense Refill  . letrozole  (FEMARA ) 2.5 mg tablet Take 2.5 mg by mouth once daily    . losartan (COZAAR) 25 MG tablet Take 1 tablet (25 mg total) by mouth once daily 90 tablet 1  . metoprolol SUCCinate (TOPROL-XL) 25 MG XL tablet TAKE 1 TABLET(25 MG) BY MOUTH EVERY DAY 90 tablet 1  . sertraline (ZOLOFT) 25 MG tablet Take 1 tablet (25 mg total) by mouth once daily 90 tablet 1  . simvastatin (ZOCOR) 20 MG tablet TAKE 1 TABLET(20 MG) BY MOUTH AT BEDTIME 90 tablet 1   No facility-administered encounter medications on file as of 06/25/2024.     ALLERGIES:   Patient has no known allergies.   SOCIAL HISTORY:  Social History   Socioeconomic History  . Marital status: Single   Tobacco Use  . Smoking status: Former    Current packs/day: 0.00    Average packs/day: 1.5 packs/day for 40.0 years (60.0 ttl pk-yrs)    Types: Cigarettes    Start date: 08/22/1953    Quit date: 08/22/1993    Years since quitting: 30.8  . Smokeless tobacco: Never  Vaping Use  . Vaping status: Never Used  Substance and Sexual Activity  . Alcohol use: Yes    Alcohol/week: 7.0 standard drinks of alcohol    Types: 7 Shots of liquor per week    Comment: Drinks 1 martini daily  . Drug use: No  . Sexual activity: Defer   Social Drivers of Health   Financial Resource Strain: Low Risk  (05/08/2024)   Received from [facility]   Overall Financial Resource Strain (CARDIA)   . How hard is it for you to pay for the very basics like food, housing, medical care, and heating?: Not hard at all  Food Insecurity: No Food Insecurity (06/04/2024)   Received from [facility]   Hunger Vital Sign   . Within the past 12 months, you worried that your food would run out before you  got the money to buy more.: Never true   . Within the past 12 months, the food you bought just didn't last and you didn't have money to get more.: Never true  Transportation Needs: No Transportation Needs (06/04/2024)   Received from [facility]   . In the past 12 months, has lack of transportation kept you from medical appointments or from getting medications?: No   . In the past 12 months, has lack of transportation kept you from meetings, work, or from getting things needed for daily living?: No    FAMILY HISTORY:  Family History  Problem Relation Name Age of Onset  . High blood pressure (Hypertension) Mother    . Stroke Mother    . Myocardial Infarction (Heart attack) Father    . Arthritis Brother       GENERAL REVIEW OF SYSTEMS:   General ROS: negative for - chills, fatigue, fever, weight gain or weight loss Allergy and Immunology ROS: negative for - hives  Hematological and Lymphatic  ROS: negative for - bleeding problems or bruising, negative for palpable nodes Endocrine ROS: negative for - heat or cold intolerance, hair changes Respiratory ROS: negative for - cough, shortness of breath or wheezing Cardiovascular ROS: no chest pain or palpitations GI ROS: negative for nausea, vomiting, abdominal pain, diarrhea, constipation Musculoskeletal ROS: negative for - joint swelling or muscle pain Neurological ROS: negative for - confusion, syncope Dermatological ROS: negative for pruritus and rash  PHYSICAL EXAM:  Vitals:   06/25/24 0908  BP: 125/66  Pulse: 62  .  Ht:154.9 cm (5' 1) Wt:54.9 kg (121 lb) ADJ:Anib surface area is 1.54 meters squared. Body mass index is 22.86 kg/m.SABRA   GENERAL: Alert, active, oriented x3  HEENT: Pupils equal reactive to light. Extraocular movements are intact. Sclera clear. Palpebral conjunctiva normal red color.Pharynx clear.  NECK: Supple with no palpable mass and no adenopathy.  LUNGS: Sound clear with no rales rhonchi or wheezes.  HEART: Regular rhythm S1 and S2 without murmur.  CHEST: Recurrent right chest seroma, no redness, no drainage  EXTREMITIES: Well-developed well-nourished symmetrical with no dependent edema.  NEUROLOGICAL: Awake alert oriented, facial expression symmetrical, moving all extremities.    Assessment & Plan Recurrent right chest seroma - Patient has had multiple drainage at bedside - Due to the size and failure of previous drainage I will recommend to take her to the operation room for formal deep drainage of the seroma - Discussed with family members and they agreed to proceed.   Seroma of breast [N64.89]          Patient and her niece verbalized understanding, all questions were answered, and were agreeable with the plan outlined above.   Lucas Sjogren, MD  Electronically signed by Lucas Sjogren, MD

## 2024-06-26 ENCOUNTER — Ambulatory Visit
Admission: RE | Admit: 2024-06-26 | Discharge: 2024-06-26 | Disposition: A | Discharge status: Home / Self Care | Attending: General Surgery | Admitting: General Surgery

## 2024-06-26 ENCOUNTER — Ambulatory Visit: Admitting: Anesthesiology

## 2024-06-26 ENCOUNTER — Encounter: Admission: RE | Disposition: A | Payer: Self-pay | Source: Home / Self Care | Attending: General Surgery

## 2024-06-26 ENCOUNTER — Other Ambulatory Visit: Payer: Self-pay

## 2024-06-26 ENCOUNTER — Encounter: Payer: Self-pay | Admitting: General Surgery

## 2024-06-26 DIAGNOSIS — Z170 Estrogen receptor positive status [ER+]: Secondary | ICD-10-CM

## 2024-06-26 DIAGNOSIS — G4730 Sleep apnea, unspecified: Secondary | ICD-10-CM | POA: Diagnosis not present

## 2024-06-26 DIAGNOSIS — E785 Hyperlipidemia, unspecified: Secondary | ICD-10-CM | POA: Diagnosis not present

## 2024-06-26 DIAGNOSIS — Z9011 Acquired absence of right breast and nipple: Secondary | ICD-10-CM | POA: Insufficient documentation

## 2024-06-26 DIAGNOSIS — Z87891 Personal history of nicotine dependence: Secondary | ICD-10-CM | POA: Diagnosis not present

## 2024-06-26 DIAGNOSIS — N6489 Other specified disorders of breast: Secondary | ICD-10-CM | POA: Diagnosis not present

## 2024-06-26 DIAGNOSIS — M96843 Postprocedural seroma of a musculoskeletal structure following other procedure: Secondary | ICD-10-CM | POA: Diagnosis not present

## 2024-06-26 DIAGNOSIS — I10 Essential (primary) hypertension: Secondary | ICD-10-CM | POA: Diagnosis not present

## 2024-06-26 HISTORY — PX: INCISION AND DRAINAGE, ABSCESS, BREAST: SHX7594

## 2024-06-26 SURGERY — INCISION AND DRAINAGE, ABSCESS, BREAST
Anesthesia: General | Site: Breast | Laterality: Right

## 2024-06-26 MED ORDER — OXYCODONE HCL 5 MG PO TABS: 5.0000 mg | ORAL_TABLET | Freq: Once | ORAL | Status: AC | PRN

## 2024-06-26 MED ORDER — MIDAZOLAM HCL 2 MG/2ML IJ SOLN
INTRAMUSCULAR | Status: AC
  Filled 2024-06-26: qty 2

## 2024-06-26 MED ORDER — FENTANYL CITRATE (PF) 100 MCG/2ML IJ SOLN
INTRAMUSCULAR | Status: AC
  Filled 2024-06-26: qty 2

## 2024-06-26 MED ORDER — BUPIVACAINE-EPINEPHRINE (PF) 0.25% -1:200000 IJ SOLN
INTRAMUSCULAR | Status: DC | PRN
  Administered 2024-06-26: 9 mL via INTRAMUSCULAR

## 2024-06-26 MED ORDER — ORAL CARE MOUTH RINSE: 15.0000 mL | Freq: Once | OROMUCOSAL | Status: DC

## 2024-06-26 MED ORDER — FENTANYL CITRATE (PF) 100 MCG/2ML IJ SOLN
INTRAMUSCULAR | Status: DC | PRN
  Administered 2024-06-26 (×2): 25 ug via INTRAVENOUS

## 2024-06-26 MED ORDER — FENTANYL CITRATE (PF) 100 MCG/2ML IJ SOLN
25.0000 ug | INTRAMUSCULAR | Status: DC | PRN
  Administered 2024-06-26 (×4): 50 ug via INTRAVENOUS

## 2024-06-26 MED ORDER — OXYCODONE HCL 5 MG/5ML PO SOLN
ORAL | Status: AC
  Filled 2024-06-26: qty 5

## 2024-06-26 MED ORDER — BUPIVACAINE-EPINEPHRINE (PF) 0.25% -1:200000 IJ SOLN
INTRAMUSCULAR | Status: AC
  Filled 2024-06-26: qty 30

## 2024-06-26 MED ORDER — LIDOCAINE HCL (PF) 2 % IJ SOLN
INTRAMUSCULAR | Status: AC
  Filled 2024-06-26: qty 5

## 2024-06-26 MED ORDER — CHLORHEXIDINE GLUCONATE 0.12 % MT SOLN
OROMUCOSAL | Status: AC
  Filled 2024-06-26: qty 15

## 2024-06-26 MED ORDER — LACTATED RINGERS IV SOLN: INTRAVENOUS | Status: DC

## 2024-06-26 MED ORDER — ONDANSETRON HCL 4 MG/2ML IJ SOLN
INTRAMUSCULAR | Status: DC | PRN
  Administered 2024-06-26: 4 mg via INTRAVENOUS

## 2024-06-26 MED ORDER — ONDANSETRON HCL 4 MG/2ML IJ SOLN
INTRAMUSCULAR | Status: AC
  Filled 2024-06-26: qty 2

## 2024-06-26 MED ORDER — PROPOFOL 500 MG/50ML IV EMUL
INTRAVENOUS | Status: DC | PRN
  Administered 2024-06-26: 100 ug/kg/min via INTRAVENOUS

## 2024-06-26 MED ORDER — OXYCODONE HCL 5 MG/5ML PO SOLN
5.0000 mg | Freq: Once | ORAL | Status: AC | PRN
  Administered 2024-06-26: 5 mg via ORAL

## 2024-06-26 MED ORDER — LIDOCAINE HCL (CARDIAC) PF 100 MG/5ML IV SOSY
PREFILLED_SYRINGE | INTRAVENOUS | Status: DC | PRN
  Administered 2024-06-26: 50 mg via INTRAVENOUS

## 2024-06-26 MED ORDER — PROPOFOL 10 MG/ML IV BOLUS
INTRAVENOUS | Status: DC | PRN
  Administered 2024-06-26: 40 mg via INTRAVENOUS
  Administered 2024-06-26 (×2): 20 mg via INTRAVENOUS
  Administered 2024-06-26: 30 mg via INTRAVENOUS
  Administered 2024-06-26: 20 mg via INTRAVENOUS

## 2024-06-26 MED ORDER — CHLORHEXIDINE GLUCONATE 0.12 % MT SOLN: 15.0000 mL | Freq: Once | OROMUCOSAL | Status: DC

## 2024-06-26 MED ORDER — DEXMEDETOMIDINE HCL IN NACL 80 MCG/20ML IV SOLN
INTRAVENOUS | Status: AC
  Filled 2024-06-26: qty 20

## 2024-06-26 MED ORDER — CEFAZOLIN SODIUM-DEXTROSE 2-4 GM/100ML-% IV SOLN
INTRAVENOUS | Status: AC
  Filled 2024-06-26: qty 100

## 2024-06-26 MED ORDER — DEXMEDETOMIDINE HCL IN NACL 80 MCG/20ML IV SOLN
INTRAVENOUS | Status: DC | PRN
Start: 2024-06-26 — End: 2024-06-26
  Administered 2024-06-26: 8 ug via INTRAVENOUS

## 2024-06-26 MED ORDER — PROPOFOL 10 MG/ML IV BOLUS
INTRAVENOUS | Status: AC
  Filled 2024-06-26: qty 40

## 2024-06-26 SURGICAL SUPPLY — 26 items
BLADE CLIPPER SURG (BLADE) IMPLANT
BLADE SURG SZ11 CARB STEEL (BLADE) ×1 IMPLANT
BRA SURGICAL LG LF (MISCELLANEOUS) IMPLANT
CHLORAPREP W/TINT 26 (MISCELLANEOUS) IMPLANT
DRAIN PENROSE 0.5X18 (DRAIN) IMPLANT
DRAPE LAPAROTOMY 77X122 PED (DRAPES) ×1 IMPLANT
ELECTRODE REM PT RTRN 9FT ADLT (ELECTROSURGICAL) ×1 IMPLANT
GAUZE PACKING IODOFORM 1/2INX (GAUZE/BANDAGES/DRESSINGS) IMPLANT
GAUZE SPONGE 4X4 12PLY STRL (GAUZE/BANDAGES/DRESSINGS) IMPLANT
GLOVE BIO SURGEON STRL SZ 6.5 (GLOVE) ×1 IMPLANT
GLOVE BIOGEL PI IND STRL 6.5 (GLOVE) ×1 IMPLANT
GLOVE SURG SYN 6.5 PF PI (GLOVE) ×2 IMPLANT
GOWN STRL REUS W/ TWL LRG LVL3 (GOWN DISPOSABLE) ×3 IMPLANT
KIT TURNOVER KIT A (KITS) ×1 IMPLANT
MANIFOLD NEPTUNE II (INSTRUMENTS) ×1 IMPLANT
NDL HYPO 22X1.5 SAFETY MO (MISCELLANEOUS) IMPLANT
NEEDLE HYPO 22X1.5 SAFETY MO (MISCELLANEOUS) IMPLANT
PACK BASIN MINOR ARMC (MISCELLANEOUS) ×1 IMPLANT
PAD ABD DERMACEA PRESS 5X9 (GAUZE/BANDAGES/DRESSINGS) IMPLANT
SOLN 0.9% NACL POUR BTL 1000ML (IV SOLUTION) ×1 IMPLANT
SOLUTION PREP PVP 2OZ (MISCELLANEOUS) IMPLANT
SPONGE T-LAP 18X18 ~~LOC~~+RFID (SPONGE) ×1 IMPLANT
SUT NYLON 3 0 (SUTURE) IMPLANT
SWAB CULTURE AMIES ANAERIB BLU (MISCELLANEOUS) IMPLANT
TRAP FLUID SMOKE EVACUATOR (MISCELLANEOUS) ×1 IMPLANT
WATER STERILE IRR 500ML POUR (IV SOLUTION) ×1 IMPLANT

## 2024-06-26 NOTE — Anesthesia Postprocedure Evaluation (Signed)
 Anesthesia Post Note  Patient: Martha  Jenkins Munoz  Procedure(s) Performed: INCISION AND DRAINAGE, ABSCESS, BREAST (Right: Breast)  Patient location during evaluation: PACU Anesthesia Type: General Level of consciousness: awake and alert Pain management: pain level controlled Vital Signs Assessment: post-procedure vital signs reviewed and stable Respiratory status: spontaneous breathing, nonlabored ventilation and respiratory function stable Cardiovascular status: blood pressure returned to baseline and stable Postop Assessment: no apparent nausea or vomiting Anesthetic complications: no   No notable events documented.   Last Vitals:  Vitals:   06/26/24 1340 06/26/24 1351  BP: 137/62 (!) 163/75  Pulse: 77 74  Resp: 16 16  Temp:  (!) 36.1 C  SpO2: 94% 98%    Last Pain:  Vitals:   06/26/24 1351  TempSrc: Temporal  PainSc: 0-No pain                 Fairy POUR Chezney Huether

## 2024-06-26 NOTE — Discharge Instructions (Addendum)
  Diet: Resume home heart healthy regular diet.   Activity: Continue previous activities  Wound care: Change absorbant dressing 1-2 times per day or as needed if drainage is saturating the dressing.   Medications: Resume all home medications. Contact office if refill of pain medication is needed.   Call office [phone]) at any time if any questions, worsening pain, fevers/chills, bleeding, drainage from incision site, or other concerns.

## 2024-06-26 NOTE — Anesthesia Preprocedure Evaluation (Addendum)
 Anesthesia Evaluation  Patient identified by MRN, date of birth, ID band Patient awake and Patient confused    Reviewed: Allergy & Precautions, NPO status , Patient's Chart, lab work & pertinent test results  History of Anesthesia Complications Negative for: history of anesthetic complications  Airway Mallampati: III  TM Distance: <3 FB Neck ROM: full    Dental  (+) Chipped   Pulmonary neg shortness of breath, sleep apnea , former smoker   Pulmonary exam normal        Cardiovascular Exercise Tolerance: Good hypertension, Normal cardiovascular exam     Neuro/Psych  PSYCHIATRIC DISORDERS      negative neurological ROS     GI/Hepatic Neg liver ROS,GERD  Controlled,,  Endo/Other  negative endocrine ROS    Renal/GU negative Renal ROS  negative genitourinary   Musculoskeletal   Abdominal   Peds  Hematology negative hematology ROS (+)   Anesthesia Other Findings Past Medical History: No date: Adnexal mass No date: Anemia No date: Breast cancer in female [facility]) No date: Colon cancer (HCC) No date: Depression No date: GERD (gastroesophageal reflux disease) No date: Hyperlipidemia No date: Hypertension No date: Skin cancer No date: Sleep apnea     Comment:  not compliant with cpap  Past Surgical History: 90s: BREAST BIOPSY; Right     Comment:  benign 05/01/2024: BREAST BIOPSY; Right     Comment:  US  RT BREAST BX W LOC DEV EA ADD LESION IMG BX SPEC US                GUIDE 05/01/2024 [facility] 05/01/2024: BREAST BIOPSY; Right     Comment:  US  RT BREAST BX W LOC DEV 1ST LESION IMG BX SPEC US                GUIDE 05/01/2024 [facility] 1995: COLON RESECTION No date: COLONOSCOPY 06/03/2024: MASTECTOMY MODIFIED RADICAL; Right     Comment:  Procedure: MASTECTOMY, MODIFIED RADICAL;  Surgeon:               Rodolph Romano, MD;  Location: [facility] ORS;  Service:              General;  Laterality:  Right;     Reproductive/Obstetrics negative OB ROS                              Anesthesia Physical Anesthesia Plan  ASA: 2  Anesthesia Plan: General   Post-op Pain Management:    Induction: Intravenous  PONV Risk Score and Plan: Propofol infusion and TIVA  Airway Management Planned: Natural Airway and Nasal Cannula  Additional Equipment:   Intra-op Plan:   Post-operative Plan:   Informed Consent: I have reviewed the patients History and Physical, chart, labs and discussed the procedure including the risks, benefits and alternatives for the proposed anesthesia with the patient or authorized representative who has indicated his/her understanding and acceptance.     Dental Advisory Given  Plan Discussed with: Anesthesiologist, CRNA and Surgeon  Anesthesia Plan Comments: (Patient and nephew in law consented for risks of anesthesia including but not limited to:  - adverse reactions to medications - risk of airway placement if required - damage to eyes, teeth, lips or other oral mucosa - nerve damage due to positioning  - sore throat or hoarseness - Damage to heart, brain, nerves, lungs, other parts of body or loss of life  They voiced understanding and assent.)  Anesthesia Quick Evaluation

## 2024-06-26 NOTE — Transfer of Care (Signed)
 Immediate Anesthesia Transfer of Care Note  Patient: Martha  Jenkins Munoz  Procedure(s) Performed: INCISION AND DRAINAGE, ABSCESS, BREAST (Right: Breast)  Patient Location: PACU  Anesthesia Type:General  Level of Consciousness: awake, alert , pateint uncooperative, and confused  Airway & Oxygen Therapy: Patient Spontanous Breathing  Post-op Assessment: Report given to RN and Post -op Vital signs reviewed and stable  Post vital signs: Reviewed and stable  Last Vitals:  Vitals Value Taken Time  BP 130/64 06/26/24 13:00  Temp    Pulse 69 06/26/24 13:07  Resp 19 06/26/24 13:07  SpO2 99 % 06/26/24 13:07  Vitals shown include unfiled device data.  Last Pain:  Vitals:   06/26/24 1305  TempSrc:   PainSc: 10-Worst pain ever         Complications: No notable events documented.

## 2024-06-26 NOTE — Interval H&P Note (Signed)
 History and Physical Interval Note:  06/26/2024 11:53 AM  Martha Munoz  has presented today for surgery, with the diagnosis of Seroma of breast N64.89.  The various methods of treatment have been discussed with the patient and family. After consideration of risks, benefits and other options for treatment, the patient has consented to  Procedure(s): INCISION AND DRAINAGE, SEROMA, BREAST (Right) as a surgical intervention.  The patient's history has been reviewed, patient examined, no change in status, stable for surgery.  I have reviewed the patient's chart and labs.  Questions were answered to the patient's satisfaction.     Lucas Sjogren

## 2024-06-26 NOTE — Op Note (Signed)
 Preoperative diagnosis: Right breast fluid collection.  Postoperative diagnosis: Same.   Procedure: Right breast mastotomy with drainage of deep fluid collection.                      Anesthesia: Deep sedation and local anesthesia.  Surgeon: Dr. Cesar Coe  Wound Classification: Clean  Indications: Patient is a 88 y.o. female with history of mastectomy on 06/03/2024.  She caught her drain few days after the mastectomy developing a large seroma.  Multiple aspiration have been done with the recurrent of the fluid collection.  Deep fluid collection was needed in the operation room for sedation and better control.  Findings: 1.  Large fluid collection on the right breast and right axillary area.  Description of procedure: The patient was taken to the operating room and placed supine on the operating table, and after deep sedation the right chest was prepped and draped in the usual sterile fashion. A time-out was completed verifying correct patient, procedure, site, positioning, and implant(s) and/or special equipment prior to beginning this procedure.  Incision was performed under the right breast.  The dissection was taken down deep into the right chest wall to drain the fluid collection of the right chest.  Abundant amount of serous fluid was drained.  A counterincision was performed in the right underarm to drain is a second/communicating fluid collection in the axillary area.  There are 2 fluid collections where connected with a Penrose.  Penrose affixed to skin with 3-0 nylon.  4 x 4 gauze and ABD pads were placed to absorb drainage.  Specimen: None  Complications: None  Estimated Blood Loss: Minimal

## 2024-06-27 ENCOUNTER — Encounter: Payer: Self-pay | Admitting: General Surgery

## 2024-06-27 ENCOUNTER — Emergency Department
Admission: EM | Admit: 2024-06-27 | Discharge: 2024-06-27 | Disposition: A | Discharge status: Home / Self Care | Source: Ambulatory Visit | Attending: Emergency Medicine | Admitting: Emergency Medicine

## 2024-06-27 ENCOUNTER — Other Ambulatory Visit: Payer: Self-pay

## 2024-06-27 DIAGNOSIS — C50811 Malignant neoplasm of overlapping sites of right female breast: Secondary | ICD-10-CM | POA: Diagnosis not present

## 2024-06-27 DIAGNOSIS — Z4801 Encounter for change or removal of surgical wound dressing: Secondary | ICD-10-CM | POA: Diagnosis not present

## 2024-06-27 DIAGNOSIS — F0393 Unspecified dementia, unspecified severity, with mood disturbance: Secondary | ICD-10-CM | POA: Diagnosis not present

## 2024-06-27 DIAGNOSIS — Z170 Estrogen receptor positive status [ER+]: Secondary | ICD-10-CM | POA: Diagnosis not present

## 2024-06-27 DIAGNOSIS — C773 Secondary and unspecified malignant neoplasm of axilla and upper limb lymph nodes: Secondary | ICD-10-CM | POA: Diagnosis not present

## 2024-06-27 DIAGNOSIS — I6523 Occlusion and stenosis of bilateral carotid arteries: Secondary | ICD-10-CM | POA: Diagnosis not present

## 2024-06-27 DIAGNOSIS — D630 Anemia in neoplastic disease: Secondary | ICD-10-CM | POA: Diagnosis not present

## 2024-06-27 DIAGNOSIS — F32A Depression, unspecified: Secondary | ICD-10-CM | POA: Diagnosis not present

## 2024-06-27 DIAGNOSIS — I10 Essential (primary) hypertension: Secondary | ICD-10-CM | POA: Diagnosis not present

## 2024-06-27 DIAGNOSIS — L7634 Postprocedural seroma of skin and subcutaneous tissue following other procedure: Secondary | ICD-10-CM | POA: Diagnosis not present

## 2024-06-27 DIAGNOSIS — Z0000 Encounter for general adult medical examination without abnormal findings: Secondary | ICD-10-CM | POA: Diagnosis not present

## 2024-06-27 DIAGNOSIS — Z139 Encounter for screening, unspecified: Secondary | ICD-10-CM

## 2024-06-27 LAB — CBC WITH DIFFERENTIAL/PLATELET
Abs Immature Granulocytes: 0.08 K/uL — ABNORMAL HIGH (ref 0.00–0.07)
Basophils Absolute: 0 K/uL (ref 0.0–0.1)
Basophils Relative: 1 %
Eosinophils Absolute: 0.1 K/uL (ref 0.0–0.5)
Eosinophils Relative: 2 %
HCT: 36.2 % (ref 36.0–46.0)
Hemoglobin: 11.6 g/dL — ABNORMAL LOW (ref 12.0–15.0)
Immature Granulocytes: 1 %
Lymphocytes Relative: 10 %
Lymphs Abs: 0.8 K/uL (ref 0.7–4.0)
MCH: 31.4 pg (ref 26.0–34.0)
MCHC: 32 g/dL (ref 30.0–36.0)
MCV: 98.1 fL (ref 80.0–100.0)
Monocytes Absolute: 0.8 K/uL (ref 0.1–1.0)
Monocytes Relative: 11 %
Neutro Abs: 5.7 K/uL (ref 1.7–7.7)
Neutrophils Relative %: 75 %
Platelets: 243 K/uL (ref 150–400)
RBC: 3.69 MIL/uL — ABNORMAL LOW (ref 3.87–5.11)
RDW: 12.3 % (ref 11.5–15.5)
WBC: 7.5 K/uL (ref 4.0–10.5)
nRBC: 0 % (ref 0.0–0.2)

## 2024-06-27 LAB — COMPREHENSIVE METABOLIC PANEL WITH GFR
ALT: 12 U/L (ref 0–44)
AST: 26 U/L (ref 15–41)
Albumin: 3.2 g/dL — ABNORMAL LOW (ref 3.5–5.0)
Alkaline Phosphatase: 57 U/L (ref 38–126)
Anion gap: 7 (ref 5–15)
BUN: 34 mg/dL — ABNORMAL HIGH (ref 8–23)
CO2: 26 mmol/L (ref 22–32)
Calcium: 8.6 mg/dL — ABNORMAL LOW (ref 8.9–10.3)
Chloride: 105 mmol/L (ref 98–111)
Creatinine, Ser: 1.05 mg/dL — ABNORMAL HIGH (ref 0.44–1.00)
GFR, Estimated: 52 mL/min — ABNORMAL LOW (ref >60)
Glucose, Bld: 108 mg/dL — ABNORMAL HIGH (ref 70–99)
Potassium: 4.8 mmol/L (ref 3.5–5.1)
Sodium: 138 mmol/L (ref 135–145)
Total Bilirubin: 0.9 mg/dL (ref 0.0–1.2)
Total Protein: 6.3 g/dL — ABNORMAL LOW (ref 6.5–8.1)

## 2024-06-27 NOTE — ED Triage Notes (Signed)
 Arrives with nephew. Patient had a revision of breast surgery.  Home health nurse came today and was concerned about VS.  86/44 99.5 65 95%  Patient states she feels alright. AAOx3. Skin warm and dry. NAD.  Breast drain in place.

## 2024-06-27 NOTE — ED Provider Notes (Signed)
 [facility] Provider Note    Event Date/Time   First MD Initiated Contact with Patient 06/27/24 1126     (approximate)   History   No chief complaint on file.   HPI  Martha Munoz  Martha Munoz is a 88 y.o. female who presents to the ED for evaluation of No chief complaint on file.   I reviewed General Surgery operative note from yesterday, I&D of right breast fluid collection.  She had a mastectomy on 10/13, recurrent fluid collection and recurrent aspirations.  Deep fluid collection taken to the OR for drainage.  Drainage of abundant serous fluid, Penrose remains.  Patient presents to the ED alongside her son for evaluation of a home health nurse getting a low blood pressure at home today.  She reports that she feels fine, does not know why that she is here and thinks this is silly.  She ate breakfast this morning and has had no emesis, diarrhea, fevers or any particular concerns.  Reports that she thought she was doing fine.  BP this morning while she was asymptomatic and seated was reportedly 86/44   Physical Exam   Triage Vital Signs: ED Triage Vitals  Encounter Vitals Group     BP 06/27/24 1117 (!) 137/57     Girls Systolic BP Percentile --      Girls Diastolic BP Percentile --      Boys Systolic BP Percentile --      Boys Diastolic BP Percentile --      Pulse Rate 06/27/24 1117 63     Resp 06/27/24 1117 16     Temp 06/27/24 1117 98 F (36.7 C)     Temp src --      SpO2 06/27/24 1117 98 %     Weight 06/27/24 1116 121 lb 14.6 oz (55.3 kg)     Height --      Head Circumference --      Peak Flow --      Pain Score 06/27/24 1116 0     Pain Loc --      Pain Education --      Exclude from Growth Chart --     Most recent vital signs: Vitals:   06/27/24 1148 06/27/24 1200  BP: 132/65 125/67  Pulse:  (!) 58  Resp:  18  Temp:    SpO2:  98%    General: Awake, no distress.  CV:  Good peripheral perfusion.  Resp:  Normal effort.  Abd:  No  distention.  MSK:  No deformity noted.  Neuro:  No focal deficits appreciated. Other:  Chaperoned exam of surgical site of the right chest with intact incisional line without dehiscence or purulence.  2 Penrose drains in place without any active drainage.  No cellulitic features, induration, purulence or fluctuance.   ED Results / Procedures / Treatments   Labs (all labs ordered are listed, but only abnormal results are displayed) Labs Reviewed  CBC WITH DIFFERENTIAL/PLATELET - Abnormal; Notable for the following components:      Result Value   RBC 3.69 (*)    Hemoglobin 11.6 (*)    Abs Immature Granulocytes 0.08 (*)    All other components within normal limits  COMPREHENSIVE METABOLIC PANEL WITH GFR - Abnormal; Notable for the following components:   Glucose, Bld 108 (*)    BUN 34 (*)    Creatinine, Ser 1.05 (*)    Calcium 8.6 (*)    Total Protein 6.3 (*)  Albumin 3.2 (*)    GFR, Estimated 52 (*)    All other components within normal limits    EKG   RADIOLOGY   Official radiology report(s): No results found.  PROCEDURES and INTERVENTIONS:  Procedures  Medications - No data to display   IMPRESSION / MDM / ASSESSMENT AND PLAN / ED COURSE  I reviewed the triage vital signs and the nursing notes.  Differential diagnosis includes, but is not limited to, hypovolemia, symptomatic anemia, sepsis, cellulitis, AKI  {Patient presents with symptoms of an acute illness or injury that is potentially life-threatening.  Patient with recent I&D yesterday presents with asymptomatic reported low blood pressure at home.  Here in the ED her blood pressure has been fine and she remains asymptomatic.  CBC normal.  Exam is reassuring and her incision/physical site looks well to me.  Awaiting metabolic panel which is in process, she is eager to go home and unless there is any drastic abnormalities on the metabolic panel I suspect she will be suitable for outpatient  management.  Clinical Course as of 06/27/24 1222  Thu Jun 27, 2024  1221 We discussed her metabolic panel results with slight worsening of her creatinine and possible etiologies of this.  She again is asymptomatic.  We discussed pushing fluids and following up with her PCP to recheck this number.  We discussed appropriate ED return precautions. [DS]    Clinical Course User Index [DS] Claudene Rover, MD     FINAL CLINICAL IMPRESSION(S) / ED DIAGNOSES   Final diagnoses:  Encounter for medical screening examination     Rx / DC Orders   ED Discharge Orders     None        Note:  This document was prepared using Dragon voice recognition software and may include unintentional dictation errors.   Claudene Rover, MD 06/27/24 [phone]

## 2024-06-27 NOTE — ED Notes (Signed)
 Fall bundle verified by this tech

## 2024-06-28 DIAGNOSIS — Z170 Estrogen receptor positive status [ER+]: Secondary | ICD-10-CM | POA: Diagnosis not present

## 2024-06-28 DIAGNOSIS — I10 Essential (primary) hypertension: Secondary | ICD-10-CM | POA: Diagnosis not present

## 2024-06-28 DIAGNOSIS — C50811 Malignant neoplasm of overlapping sites of right female breast: Secondary | ICD-10-CM | POA: Diagnosis not present

## 2024-06-28 DIAGNOSIS — F0393 Unspecified dementia, unspecified severity, with mood disturbance: Secondary | ICD-10-CM | POA: Diagnosis not present

## 2024-06-28 DIAGNOSIS — L7634 Postprocedural seroma of skin and subcutaneous tissue following other procedure: Secondary | ICD-10-CM | POA: Diagnosis not present

## 2024-06-28 DIAGNOSIS — I6523 Occlusion and stenosis of bilateral carotid arteries: Secondary | ICD-10-CM | POA: Diagnosis not present

## 2024-06-28 DIAGNOSIS — D630 Anemia in neoplastic disease: Secondary | ICD-10-CM | POA: Diagnosis not present

## 2024-06-28 DIAGNOSIS — C773 Secondary and unspecified malignant neoplasm of axilla and upper limb lymph nodes: Secondary | ICD-10-CM | POA: Diagnosis not present

## 2024-06-28 DIAGNOSIS — F32A Depression, unspecified: Secondary | ICD-10-CM | POA: Diagnosis not present

## 2024-07-01 ENCOUNTER — Encounter: Payer: Self-pay | Admitting: *Deleted

## 2024-07-02 NOTE — Progress Notes (Signed)
   History of Present Illness Martha Munoz is an 88 year old female who presents for evaluation after drainage of a large seroma of the right chest following a total mastectomy.  She underwent a total mastectomy with a modified radical mastectomy on May 31, 2034. A few days post-surgery, she cut her drain, leading to the development of a large seroma. This seroma was initially drained multiple times but continued to recur and increase in size.  On June 24, 2034, a formal incision and drainage of the seroma was performed, and a Penrose drain was left in place. She notes that the area was 'pretty wet' this morning and felt a drip from the site. She is managing the drainage with gauzes and pads for absorption and uses a binder or a bra for compression to minimize fluid accumulation, although she finds the tape used to secure dressings irritating. She is using paper tape, which is more gentle on her skin.   PAST SURGICAL HISTORY:   Past Surgical History:  Procedure Laterality Date  . COLONOSCOPY  09/24/2003   [facility] w/Colon Resection in 1995  . COLONOSCOPY  11/25/2003   [facility]  . COLONOSCOPY  01/15/2008   [facility]  . EGD  01/15/2008  . COLONOSCOPY  05/14/2012   [facility]: CBF 04/2017; Recall Ltr mailed 03/28/2017 (dw)  . EGD  03/03/2014   No repeat per RTE  . OOPHORECTOMY  09/22/2014   bilateral salpingectomy  . MASTECTOMY MODIFIED RADICAL Right 06/03/2024   Dr Lucas Catchings  . INCISION & DRAINAGE ABSCESS BREAST Right 06/26/2024   Dr Lucas Catchings  . BREAST SURGERY    . nasal surgery    . PELVIC LAPAROSCOPY    . TONSILLECTOMY AND ADENOIDECTOMY           PHYSICAL EXAM:  Vitals:   07/02/24 1019  BP: 133/78  Pulse: 77  .  Ht:154.9 cm (5' 1) Wt:54.9 kg (121 lb) ADJ:Anib surface area is 1.54 meters squared. Body mass index is 22.86 kg/m.SABRA   GENERAL: Alert, active, oriented x3  CHEST: Right chest significantly better compared to previous evaluation.  Seroma has almost  completely resolved.  She does reaccumulated a small seroma on the medial portion of the chest.  No sign of infection.   Assessment & Plan Recurrent right chest seroma status post mastectomy   Recurrent seroma in the right chest followed a modified radical mastectomy on October 13th, 2025. Initial drainage was performed multiple times, but due to recurrence and size, a formal incision and drainage was done on November 3rd, 2025, with a Penrose drain placed. The seroma is improving with reduced swelling and drainage. The drain has retracted, creating a pocket, but the seroma is smaller and less swollen. No discoloration or signs of infection are present. Continue to leave the Penrose drain in place for another week to allow further drainage and drying. Use paper tape to secure the drain and prevent movement. Encourage use of a binder or supportive bra to minimize accumulation and provide compression. Schedule follow-up appointment in one week for potential removal of the Penrose drain.  Seroma of breast [N64.89]         Patient and her niece verbalized understanding, all questions were answered, and were agreeable with the plan outlined above.   Lucas Sjogren, MD

## 2024-07-03 ENCOUNTER — Ambulatory Visit: Attending: Oncology | Admitting: Occupational Therapy

## 2024-07-03 ENCOUNTER — Encounter: Payer: Self-pay | Admitting: Occupational Therapy

## 2024-07-03 DIAGNOSIS — L905 Scar conditions and fibrosis of skin: Secondary | ICD-10-CM | POA: Insufficient documentation

## 2024-07-03 DIAGNOSIS — M25611 Stiffness of right shoulder, not elsewhere classified: Secondary | ICD-10-CM | POA: Insufficient documentation

## 2024-07-03 DIAGNOSIS — N6489 Other specified disorders of breast: Secondary | ICD-10-CM | POA: Diagnosis not present

## 2024-07-03 NOTE — Therapy (Signed)
 OUTPATIENT OCCUPATIONAL THERAPY BREAST CANCER POSTOP EVALUATION   Patient Name: Martha  Martha Munoz MRN: [id number] DOB:09-Jul-1936, 88 y.o., female Today's Date: 07/03/2024  END OF SESSION:  OT End of Session - 07/03/24 1822     Visit Number 1    Number of Visits 6    Date for Recertification  08/28/24    OT Start Time 1002    OT Stop Time 1030    OT Time Calculation (min) 28 min    Activity Tolerance Patient tolerated treatment well    Behavior During Therapy WFL for tasks assessed/performed          Past Medical History:  Diagnosis Date   Adnexal mass    Anemia    Breast cancer in female [facility])    Colon cancer (HCC)    Depression    GERD (gastroesophageal reflux disease)    Hyperlipidemia    Hypertension    Skin cancer    Sleep apnea    not compliant with cpap   Past Surgical History:  Procedure Laterality Date   BREAST BIOPSY Right 90s   benign   BREAST BIOPSY Right 05/01/2024   US  RT BREAST BX W LOC DEV EA ADD LESION IMG BX SPEC US  GUIDE 05/01/2024 [facility]   BREAST BIOPSY Right 05/01/2024   US  RT BREAST BX W LOC DEV 1ST LESION IMG BX SPEC US  GUIDE 05/01/2024 [facility]   COLON RESECTION  1995   COLONOSCOPY     INCISION AND DRAINAGE, ABSCESS, BREAST Right 06/26/2024   Procedure: INCISION AND DRAINAGE, ABSCESS, BREAST;  Surgeon: Rodolph Romano, MD;  Location: [facility];  Service: General;  Laterality: Right;   MASTECTOMY MODIFIED RADICAL Right 06/03/2024   Procedure: MASTECTOMY, MODIFIED RADICAL;  Surgeon: Rodolph Romano, MD;  Location: [facility];  Service: General;  Laterality: Right;   Patient Active Problem List   Diagnosis Date Noted   Breast cancer (HCC) 06/03/2024   Malignant neoplasm of overlapping sites of right breast in female, estrogen receptor positive (HCC) 05/09/2024    PCP: Dr Sadie  REFERRING PROVIDER: Dr Melanee MART DIAG: R mastectomy with seroma   THERAPY DIAG:  Scar tissue  Seroma of breast  Stiffness  of right shoulder, not elsewhere classified  Rationale for Evaluation and Treatment: Rehabilitation  ONSET DATE: 06/03/24  SUBJECTIVE:                                                                                                                                                                                           SUBJECTIVE STATEMENT: Patient reports she had a right mastectomy but then developed the seroma.  And had to had  more surgery.  Did see the surgeon yesterday.  But still some drainage.  PERTINENT HISTORY:   Dr. Rodolph note from 07/02/2024 History of Present Illness Martha Munoz  A Mask is an 88 year old female who presents for evaluation after drainage of a large seroma of the right chest following a total mastectomy.  She underwent a total mastectomy with a modified radical mastectomy on May 31, 2034. A few days post-surgery, she cut her drain, leading to the development of a large seroma. This seroma was initially drained multiple times but continued to recur and increase in size.  On June 24, 2034, a formal incision and drainage of the seroma was performed, and a Penrose drain was left in place. She notes that the area was 'pretty wet' this morning and felt a drip from the site. She is managing the drainage with gauzes and pads for absorption and uses a binder or a bra for compression to minimize fluid accumulation, although she finds the tape used to secure dressings irritating. She is using paper tape, which is more gentle on her skin   CHEST: Right chest significantly better compared to previous evaluation. Seroma has almost completely resolved. She does reaccumulated a small seroma on the medial portion of the chest. No sign of infection.  Assessment & Plan Recurrent right chest seroma status post mastectomy  Recurrent seroma in the right chest followed a modified radical mastectomy on October 13th, 2025. Initial drainage was performed multiple times, but due  to recurrence and size, a formal incision and drainage was done on November 3rd, 2025, with a Penrose drain placed. The seroma is improving with reduced swelling and drainage. The drain has retracted, creating a pocket, but the seroma is smaller and less swollen. No discoloration or signs of infection are present. Continue to leave the Penrose drain in place for another week to allow further drainage and drying. Use paper tape to secure the drain and prevent movement. Encourage use of a binder or supportive bra to minimize accumulation and provide compression. Schedule follow-up appointment in one week for potential removal of the Penrose drain.   PATIENT GOALS:   reduce lymphedema risk and learn post op HEP.   PAIN:  Are you having pain?  Some discomfort and pain with shoulder at 90 degrees abduction external rotation.  Facial expressions no number provided  PRECAUTIONS: patient to keep shoulder range of motion below 90 while drain is in place; right upper extremity lymphedema risk    HAND DOMINANCE:   WEIGHT BEARING RESTRICTIONS:   FALLS:  Has patient fallen in last 6 months? No  LIVING ENVIRONMENT: Patient lives with: Alone  OCCUPATION: Patient retired.  Used to work for [employer].  Patient lives alone.  Has 4 nieces in town that assist her with appointments.  Prior to surgery patient drives and does her own cooking and laundry.  And independent in bathing and dressing.  Patient has assessable walk-in shower.  LEISURE: Likes to read   OBJECTIVE:  COGNITION: Overall cognitive status: Within functional limits for tasks assessed    POSTURE:  Forward head and rounded shoulders posture  UPPER EXTREMITY AROM/PROM:  Left shoulder range of motion to 90 degrees.  Patient reports she had a shoulder fracture in the past Right shoulder flexion and abduction to 90 degrees patient show facial grimace with discomfort and pain at the incision and pec muscle External rotation within functional range  for patient to wash hands and wash hair CERVICAL AROM: All within normal limits:  UPPER EXTREMITY STRENGTH: Not tested  LYMPHEDEMA ASSESSMENTS:   LYMPHEDEMA/ONCOLOGY QUESTIONNAIRE - 07/03/24 0001       Right Upper Extremity Lymphedema   15 cm Proximal to Olecranon Process 29 cm    10 cm Proximal to Olecranon Process 27 cm    Olecranon Process 22.5 cm    15 cm Proximal to Ulnar Styloid Process 21.5 cm    10 cm Proximal to Ulnar Styloid Process 18.8 cm    Just Proximal to Ulnar Styloid Process 16 cm    Across Hand at [facility] 18.4 cm      Left Upper Extremity Lymphedema   15 cm Proximal to Olecranon Process 27.5 cm    10 cm Proximal to Olecranon Process 24 cm    Olecranon Process 27.5 cm    15 cm Proximal to Ulnar Styloid Process 20 cm    10 cm Proximal to Ulnar Styloid Process 17.4 cm    Just Proximal to Ulnar Styloid Process 14.8 cm    Across Hand at [facility] 17.5 cm           L-DEX LYMPHEDEMA SCREENING:  The patient was assessed using the L-Dex machine today to produce a lymphedema index baseline score. The patient will be reassessed on a regular basis (typically every 3 months) to obtain new L-Dex scores. If the score is > 6.5 points away from his/her baseline score indicating onset of subclinical lymphedema, it will be recommended to wear a compression garment for 4 weeks, 12 hours per day and then be reassessed. If the score continues to be > 6.5 points from baseline at reassessment, we will initiate lymphedema treatment. Assessing in this manner has a 95% rate of preventing clinically significant lymphedema. Will be done next time.  Patient still has drain in place.    PATIENT EDUCATION:  Education details: Lymphedema risk reduction and post op shoulder/posture HEP Person educated: Patient and nephew Education method: Explanation, Demonstration, Handout Education comprehension: Patient verbalized understanding and returned demonstration  HOME  EXERCISE PROGRAM: Patient to use wand in supine to perform shoulder flexion and abduction-active assisted range of motion to 90 degrees 10-12 reps to 3 times a day Stop when feeling pain. Can do gentle external rotation in supine with the right upper extremity only relaxing the arm with gravity 10-12 reps pain-free To 3 times a day   ASSESSMENT:  CLINICAL IMPRESSION: Patient presented OT evaluation postop right modified radical mastectomy on June 03, 2024 by Dr. Barnabas right chest seroma status post mastectomy -that was drained multiple times. - formal incision and drainage was done on November 3rd, 2025, with a Penrose drain placed. The seroma is improving with reduced swelling and drainage. The drain has retracted, creating a pocket, but the seroma is smaller and less swollen.  Patient to continue to wear her binder or supportive bra to minimize accumulation and provide compression. follow-up appointment next week with Dr. Cesar for potential removal of the Penrose drain.  Patient present at OT evaluation with binder on.  Patient limited in shoulder range of motion to about 90 degrees forward flexion abduction.  As well as external rotation within functional limits to take care of of her ADLs.  Patient encouraged to continue to work on active assisted range of motion for shoulder flexion and abduction in supine to 90 in a pain-free range while the drain is in as well as external rotation in supine.  Patient and family was educated on signs and symptoms as well as  prevention of lymphedema.  Information provided in handout.  Patient can read her home and ask some more questions next session.  She will benefit from continued OT services as well as monitoring lymphedema circumference and L-Dex score.  As well as continues needs and from L-Dex screens every 3 months for 2 years to detect subclinical lymphedema.  Pt will benefit from skilled therapeutic intervention to improve on the  following deficits: Decreased knowledge of precautions and lymphedema education, impaired UE functional use, pain, decreased ROM, postural dysfunction.   OT treatment/interventions: ADL/self-care home management, pt/family education, therapeutic exercise,manual therapy  REHAB POTENTIAL: Good  CLINICAL DECISION MAKING: Stable/uncomplicated  EVALUATION COMPLEXITY: Low   GOALS: Goals reviewed with patient? YES  LONG TERM GOALS: (STG=LTG)    Name Target Date Goal status  1 Pt will be able to verbalize understanding of pertinent lymphedema risk reduction practices relevant to her dx specifically related to skin care.  Baseline:  No knowledge 8 weeks Initial  2 Pt will be able to return demo and/or verbalize understanding of the post op HEP related to regaining shoulder ROM. Baseline:  No knowledge 8 weeks Initial       4 Pt will demo she has regained full shoulder ROM and function post operatively compared to baselines.  Baseline: See objective measurements taken today. 8 weeks Initial    PLAN:  OT FREQUENCY/DURATION: EVAL and 2-5 visits  PLAN FOR   T Occupational Therapy Information for After Breast Cancer Surgery/Treatment:  Lymphedema is a swelling condition that you may be at risk for in your arm if you have lymph nodes removed from the armpit area.  After a sentinel node biopsy, the risk is approximately 5-9% and is higher after an axillary node dissection.  There is treatment available for this condition and it is not life-threatening.  Contact your physician or occupational therapist with concerns. You may begin the 4 shoulder/posture exercises (see additional sheet) when permitted by your physician (typically a week after surgery).  If you have drains, you may need to wait until those are removed before beginning range of motion exercises.  A general recommendation is to not lift your arms above shoulder height until drains are removed.  These exercises should be done to your  tolerance and gently.  This is not a no pain/no gain type of recovery so listen to your body and stretch into the range of motion that you can tolerate, stopping if you have pain.  If you are having immediate reconstruction, ask your plastic surgeon about doing exercises as he or she may want you to wait. .  While undergoing any medical procedure or treatment, try to avoid blood pressure being taken or needle sticks from occurring on the arm on the side of cancer.   This recommendation begins after surgery and continues for the rest of your life.  This may help reduce your risk of getting lymphedema (swelling in your arm). An excellent resource for those seeking information on lymphedema is the National Lymphedema Network's web site. It can be accessed at www.lymphnet.org If you notice swelling in your hand, arm or breast at any time following surgery (even if it is many years from now), please contact your doctor or occupational therapist to discuss this.  Lymphedema can be treated at any time but it is easier for you if it is treated early on.  If you feel like your shoulder motion is not returning to normal in a reasonable amount of time, please contact your  surgeon or occupational therapist.  [facility] [phone]. [address], [place], [place] [address]       Ancel Peters, OTR/L,CLT 07/03/2024, 6:25 PM

## 2024-07-04 ENCOUNTER — Encounter: Payer: Self-pay | Admitting: Radiation Oncology

## 2024-07-04 ENCOUNTER — Ambulatory Visit
Admission: RE | Admit: 2024-07-04 | Discharge: 2024-07-04 | Disposition: A | Discharge status: Home / Self Care | Source: Ambulatory Visit | Attending: Radiation Oncology | Admitting: Radiation Oncology

## 2024-07-04 VITALS — BP 134/77 | HR 69 | Temp 98.3°F | Resp 16 | Wt 121.0 lb

## 2024-07-04 DIAGNOSIS — I10 Essential (primary) hypertension: Secondary | ICD-10-CM | POA: Insufficient documentation

## 2024-07-04 DIAGNOSIS — C50811 Malignant neoplasm of overlapping sites of right female breast: Secondary | ICD-10-CM | POA: Insufficient documentation

## 2024-07-04 DIAGNOSIS — Z85038 Personal history of other malignant neoplasm of large intestine: Secondary | ICD-10-CM | POA: Diagnosis not present

## 2024-07-04 DIAGNOSIS — Z87891 Personal history of nicotine dependence: Secondary | ICD-10-CM | POA: Insufficient documentation

## 2024-07-04 DIAGNOSIS — K219 Gastro-esophageal reflux disease without esophagitis: Secondary | ICD-10-CM | POA: Insufficient documentation

## 2024-07-04 DIAGNOSIS — K869 Disease of pancreas, unspecified: Secondary | ICD-10-CM | POA: Insufficient documentation

## 2024-07-04 DIAGNOSIS — Z85828 Personal history of other malignant neoplasm of skin: Secondary | ICD-10-CM | POA: Insufficient documentation

## 2024-07-04 DIAGNOSIS — E785 Hyperlipidemia, unspecified: Secondary | ICD-10-CM | POA: Diagnosis not present

## 2024-07-04 DIAGNOSIS — Z79811 Long term (current) use of aromatase inhibitors: Secondary | ICD-10-CM | POA: Diagnosis not present

## 2024-07-04 DIAGNOSIS — Z79899 Other long term (current) drug therapy: Secondary | ICD-10-CM | POA: Diagnosis not present

## 2024-07-04 DIAGNOSIS — Z923 Personal history of irradiation: Secondary | ICD-10-CM | POA: Diagnosis not present

## 2024-07-04 DIAGNOSIS — Z170 Estrogen receptor positive status [ER+]: Secondary | ICD-10-CM | POA: Insufficient documentation

## 2024-07-04 DIAGNOSIS — G4730 Sleep apnea, unspecified: Secondary | ICD-10-CM | POA: Insufficient documentation

## 2024-07-04 NOTE — Consult Note (Signed)
 NEW PATIENT EVALUATION  Name: Martha Munoz  Martha Munoz  MRN: [id number]  Date:   07/04/2024     DOB: [birth year]   This 88 y.o. female patient presents to the clinic for initial evaluation of stage IIIa (cT3 N1 M0) ER positive PR negative HER2 not overexpressed invasive lobular carcinoma of the right breast status post right modified radical mastectomy.  REFERRING PHYSICIAN: Melanee Annah BROCKS, MD  CHIEF COMPLAINT: No chief complaint on file.   DIAGNOSIS: The encounter diagnosis was Malignant neoplasm of overlapping sites of right breast in female, estrogen receptor positive (HCC).   PREVIOUS INVESTIGATIONS:  Surgical pathology reports reviewed Clinical notes reviewed Mammogram and ultrasound reviewed  HPI: Patient is a 88 year old female who noted a self discovered right breast mass.  Initial diagnostic mammogram showed a constellation of findings concerning for extensive multicentric malignancy involving 4 quadrants of the right breast.  Tumor was at least 10 cm and involving the nipple.  There is also skin thickening of the lower right breast nonspecific although this could be compatible with underlying inflammatory malignancy.  There is also singly mildly enlarged low right axillary lymph node.  PET scan confirmed hypermetabolic right breast cancer and small right axillary lymph node no evidence of distant metastatic disease.  She does have a 2.8 cm hypodense lesion in the pancreatic body new from 515.  Breast MRI also confirmed mild right axillary lymphadenopathy with mass in the right breast spanning nearly 9 cm with involvement directly to the nipple and skin invasion anteriorly.  Biopsy was positive for invasive mammary carcinoma.  She underwent a right modified radical mastectomy showing multicentric multifocal invasive lobular carcinoma overall grade 2.  Margins were clear but close at 1 mm from the posterior margin.  There was metastatic carcinoma in 1 of 16 lymph nodes examined.  No evidence  of extranodal extension.  Patient has been consulted by medical oncology and has declined systemic treatment.  She is seen today for possible radiation therapy.  She does have a drain in place some erythema of the skin is being followed closely by surgeon.  She did have a large seroma after completion of surgery.  She does have a Penrose drain present at this time.  PLANNED TREATMENT REGIMEN: Right chest wall radiation therapy  PAST MEDICAL HISTORY:  has a past medical history of Adnexal mass, Anemia, Breast cancer in female [facility]), Colon cancer (HCC), Depression, GERD (gastroesophageal reflux disease), Hyperlipidemia, Hypertension, Skin cancer, and Sleep apnea.    PAST SURGICAL HISTORY:  Past Surgical History:  Procedure Laterality Date   BREAST BIOPSY Right 90s   benign   BREAST BIOPSY Right 05/01/2024   US  RT BREAST BX W LOC DEV EA ADD LESION IMG BX SPEC US  GUIDE 05/01/2024 [facility]   BREAST BIOPSY Right 05/01/2024   US  RT BREAST BX W LOC DEV 1ST LESION IMG BX SPEC US  GUIDE 05/01/2024 [facility]   COLON RESECTION  1995   COLONOSCOPY     INCISION AND DRAINAGE, ABSCESS, BREAST Right 06/26/2024   Procedure: INCISION AND DRAINAGE, ABSCESS, BREAST;  Surgeon: Rodolph Romano, MD;  Location: [facility];  Service: General;  Laterality: Right;   MASTECTOMY MODIFIED RADICAL Right 06/03/2024   Procedure: MASTECTOMY, MODIFIED RADICAL;  Surgeon: Rodolph Romano, MD;  Location: [facility];  Service: General;  Laterality: Right;    FAMILY HISTORY: family history includes CVA in her mother; Heart attack in her father.  SOCIAL HISTORY:  reports that she has quit smoking. Her smoking use included cigarettes. She does not  have any smokeless tobacco history on file. She reports current alcohol use. She reports that she does not use drugs.  ALLERGIES: Patient has no known allergies.  MEDICATIONS:  Current Outpatient Medications  Medication Sig Dispense Refill   diazepam  (VALIUM ) 5 MG  tablet Take 1 tablet (5 mg total) by mouth every 6 (six) hours as needed. Take one tablet (5 mg total) by mouth before MRI's as needed. 30 tablet 0   ibuprofen (ADVIL) 800 MG tablet Take 1 tablet (800 mg total) by mouth every 8 (eight) hours as needed for mild pain (pain score 1-3) or moderate pain (pain score 4-6). 30 tablet 0   letrozole  (FEMARA ) 2.5 MG tablet Take 1 tablet (2.5 mg total) by mouth daily. (Patient taking differently: Take 2.5 mg by mouth at bedtime.) 30 tablet 3   losartan (COZAAR) 100 MG tablet Take 25 mg by mouth at bedtime.     metoprolol succinate (TOPROL-XL) 25 MG 24 hr tablet Take 25 mg by mouth at bedtime.     simvastatin (ZOCOR) 20 MG tablet Take 20 mg by mouth daily at 6 PM.     No current facility-administered medications for this encounter.    ECOG PERFORMANCE STATUS:  1 - Symptomatic but completely ambulatory  REVIEW OF SYSTEMS: Patient denies any weight loss, fatigue, weakness, fever, chills or night sweats. Patient denies any loss of vision, blurred vision. Patient denies any ringing  of the ears or hearing loss. No irregular heartbeat. Patient denies heart murmur or history of fainting. Patient denies any chest pain or pain radiating to her upper extremities. Patient denies any shortness of breath, difficulty breathing at night, cough or hemoptysis. Patient denies any swelling in the lower legs. Patient denies any nausea vomiting, vomiting of blood, or coffee ground material in the vomitus. Patient denies any stomach pain. Patient states has had normal bowel movements no significant constipation or diarrhea. Patient denies any dysuria, hematuria or significant nocturia. Patient denies any problems walking, swelling in the joints or loss of balance. Patient denies any skin changes, loss of hair or loss of weight. Patient denies any excessive worrying or anxiety or significant depression. Patient denies any problems with insomnia. Patient denies excessive thirst, polyuria,  polydipsia. Patient denies any swollen glands, patient denies easy bruising or easy bleeding. Patient denies any recent infections, allergies or URI. Patient s visual fields have not changed significantly in recent time.   PHYSICAL EXAM: BP 134/77   Pulse 69   Temp 98.3 F (36.8 C) (Tympanic)   Resp 16   Wt 121 lb (54.9 kg)   BMI 22.86 kg/m  Elderly female in NAD.  She status post a right modified radical mastectomy.  There is significant seroma I believe still present with some slight erythema of the skin although I do not believe there is mastitis at this time.  She does have a Penrose drain in place.  Left breast is free of dominant mass.  No axillary or supraclavicular adenopathy is appreciated.  Well-developed well-nourished patient in NAD. HEENT reveals PERLA, EOMI, discs not visualized.  Oral cavity is clear. No oral mucosal lesions are identified. Neck is clear without evidence of cervical or supraclavicular adenopathy. Lungs are clear to A&P. Cardiac examination is essentially unremarkable with regular rate and rhythm without murmur rub or thrill. Abdomen is benign with no organomegaly or masses noted. Motor sensory and DTR levels are equal and symmetric in the upper and lower extremities. Cranial nerves II through XII are grossly intact. Proprioception  is intact. No peripheral adenopathy or edema is identified. No motor or sensory levels are noted. Crude visual fields are within normal range.  LABORATORY DATA: Pathology reports reviewed    RADIOLOGY RESULTS: Mammograms ultrasound in 6 MRI of her breasts are reviewed compatible with above-stated findings   IMPRESSION: Stage IIIa ER positive invasive mammary carcinoma of the right breast status post modified radical mastectomy in 88 year old female  PLAN: At this time based on the large size of the tumor multicentric nature possible skin involvement as well as possible positive node and in view that she is declining chemotherapy would  offer chest wall radiation.  I would do it in a hypofractionated course over 3 weeks boosting her scar another 1000 cGy using electrons.  Risks and benefits of treatment including skin reaction fatigue alteration blood counts possible inclusion of superficial lung all were reviewed with the patient.  She comprehends my treatment plan well.  He is accompanied by her niece today.  I would like more healing and I have set her up for simulation after Thanksgiving.  Patient is somewhat reluctant about additional treatment although when I have explained to her the significance of recurrence at this stage she I believe is inclined to proceed with treatment.  She also will be a candidate for endocrine therapy after completion of radiation.  I would like to take this opportunity to thank you for allowing me to participate in the care of your patient.Martha Marcey Penton, MD

## 2024-07-10 DIAGNOSIS — I10 Essential (primary) hypertension: Secondary | ICD-10-CM | POA: Diagnosis not present

## 2024-07-10 DIAGNOSIS — F0393 Unspecified dementia, unspecified severity, with mood disturbance: Secondary | ICD-10-CM | POA: Diagnosis not present

## 2024-07-10 DIAGNOSIS — C50811 Malignant neoplasm of overlapping sites of right female breast: Secondary | ICD-10-CM | POA: Diagnosis not present

## 2024-07-10 DIAGNOSIS — C773 Secondary and unspecified malignant neoplasm of axilla and upper limb lymph nodes: Secondary | ICD-10-CM | POA: Diagnosis not present

## 2024-07-10 DIAGNOSIS — D630 Anemia in neoplastic disease: Secondary | ICD-10-CM | POA: Diagnosis not present

## 2024-07-10 DIAGNOSIS — I6523 Occlusion and stenosis of bilateral carotid arteries: Secondary | ICD-10-CM | POA: Diagnosis not present

## 2024-07-10 DIAGNOSIS — Z170 Estrogen receptor positive status [ER+]: Secondary | ICD-10-CM | POA: Diagnosis not present

## 2024-07-10 DIAGNOSIS — F32A Depression, unspecified: Secondary | ICD-10-CM | POA: Diagnosis not present

## 2024-07-22 ENCOUNTER — Telehealth: Payer: Self-pay | Admitting: Oncology

## 2024-07-22 NOTE — Telephone Encounter (Signed)
 Pt called and said she needed more info for this CT and needed to r/s appt, but wasn't ready to r/s yet - said she will call back when she is ready - [facility]

## 2024-07-23 ENCOUNTER — Ambulatory Visit

## 2024-07-24 ENCOUNTER — Ambulatory Visit: Attending: Oncology | Admitting: Occupational Therapy

## 2024-07-24 DIAGNOSIS — C50811 Malignant neoplasm of overlapping sites of right female breast: Secondary | ICD-10-CM | POA: Diagnosis not present

## 2024-07-24 DIAGNOSIS — L905 Scar conditions and fibrosis of skin: Secondary | ICD-10-CM | POA: Diagnosis present

## 2024-07-24 DIAGNOSIS — I10 Essential (primary) hypertension: Secondary | ICD-10-CM | POA: Diagnosis not present

## 2024-07-24 DIAGNOSIS — L7634 Postprocedural seroma of skin and subcutaneous tissue following other procedure: Secondary | ICD-10-CM | POA: Diagnosis not present

## 2024-07-24 DIAGNOSIS — N6489 Other specified disorders of breast: Secondary | ICD-10-CM | POA: Insufficient documentation

## 2024-07-24 DIAGNOSIS — I6523 Occlusion and stenosis of bilateral carotid arteries: Secondary | ICD-10-CM | POA: Diagnosis not present

## 2024-07-24 DIAGNOSIS — Z170 Estrogen receptor positive status [ER+]: Secondary | ICD-10-CM | POA: Diagnosis not present

## 2024-07-24 DIAGNOSIS — F32A Depression, unspecified: Secondary | ICD-10-CM | POA: Diagnosis not present

## 2024-07-24 DIAGNOSIS — M25611 Stiffness of right shoulder, not elsewhere classified: Secondary | ICD-10-CM | POA: Diagnosis present

## 2024-07-24 DIAGNOSIS — F0393 Unspecified dementia, unspecified severity, with mood disturbance: Secondary | ICD-10-CM | POA: Diagnosis not present

## 2024-07-24 DIAGNOSIS — C773 Secondary and unspecified malignant neoplasm of axilla and upper limb lymph nodes: Secondary | ICD-10-CM | POA: Diagnosis not present

## 2024-07-24 DIAGNOSIS — D630 Anemia in neoplastic disease: Secondary | ICD-10-CM | POA: Diagnosis not present

## 2024-07-24 NOTE — Therapy (Signed)
 OUTPATIENT OCCUPATIONAL THERAPY BREAST CANCER POSTOP EVALUATION   Patient Name: Martha Munoz MRN: [id number] DOB:1935-12-29, 88 y.o., female Today's Date: 07/24/2024  END OF SESSION:  OT End of Session - 07/24/24 0917     Visit Number 2    Number of Visits 6    Date for Recertification  08/28/24    OT Start Time 0832    OT Stop Time 0900    OT Time Calculation (min) 28 min    Activity Tolerance Patient tolerated treatment well    Behavior During Therapy WFL for tasks assessed/performed          Past Medical History:  Diagnosis Date   Adnexal mass    Anemia    Breast cancer in female [name])    Colon cancer (HCC)    Depression    GERD (gastroesophageal reflux disease)    Hyperlipidemia    Hypertension    Skin cancer    Sleep apnea    not compliant with cpap   Past Surgical History:  Procedure Laterality Date   BREAST BIOPSY Right 90s   benign   BREAST BIOPSY Right 05/01/2024   US  RT BREAST BX W LOC DEV EA ADD LESION IMG BX SPEC US  GUIDE 05/01/2024 [facility]   BREAST BIOPSY Right 05/01/2024   US  RT BREAST BX W LOC DEV 1ST LESION IMG BX SPEC US  GUIDE 05/01/2024 [facility]   COLON RESECTION  1995   COLONOSCOPY     INCISION AND DRAINAGE, ABSCESS, BREAST Right 06/26/2024   Procedure: INCISION AND DRAINAGE, ABSCESS, BREAST;  Surgeon: Rodolph Romano, MD;  Location: [facility];  Service: General;  Laterality: Right;   MASTECTOMY MODIFIED RADICAL Right 06/03/2024   Procedure: MASTECTOMY, MODIFIED RADICAL;  Surgeon: Rodolph Romano, MD;  Location: [facility];  Service: General;  Laterality: Right;   Patient Active Problem List   Diagnosis Date Noted   Breast cancer (HCC) 06/03/2024   Malignant neoplasm of overlapping sites of right breast in female, estrogen receptor positive (HCC) 05/09/2024    PCP: Dr Sadie  REFERRING PROVIDER: Dr Melanee MART DIAG: R mastectomy with seroma   THERAPY DIAG:  Scar tissue  Seroma of breast  Stiffness  of right shoulder, not elsewhere classified  Rationale for Evaluation and Treatment: Rehabilitation  ONSET DATE: 06/03/24  SUBJECTIVE:                                                                                                                                                                                           SUBJECTIVE STATEMENT: Patient reports she had a right mastectomy but then developed the seroma.  And had to had  more surgery.  Did see the surgeon yesterday again and drained was taken out the previous appt and yesteray everything looked great - one more appt with surgeon - but I done my arm exercises and it feels like it was before the surgery - no pull or pain .  But still some drainage.  PERTINENT HISTORY:   Dr. Rodolph note from 07/02/2024 History of Present Illness Martha  A Munoz is an 88 year old female who presents for evaluation after drainage of a large seroma of the right chest following a total mastectomy.  She underwent a total mastectomy with a modified radical mastectomy on May 31, 2034. A few days post-surgery, she cut her drain, leading to the development of a large seroma. This seroma was initially drained multiple times but continued to recur and increase in size.  On June 24, 2034, a formal incision and drainage of the seroma was performed, and a Penrose drain was left in place. She notes that the area was 'pretty wet' this morning and felt a drip from the site. She is managing the drainage with gauzes and pads for absorption and uses a binder or a bra for compression to minimize fluid accumulation, although she finds the tape used to secure dressings irritating. She is using paper tape, which is more gentle on her skin   CHEST: Right chest significantly better compared to previous evaluation. Seroma has almost completely resolved. She does reaccumulated a small seroma on the medial portion of the chest. No sign of infection.  Assessment &  Plan Recurrent right chest seroma status post mastectomy  Recurrent seroma in the right chest followed a modified radical mastectomy on October 13th, 2025. Initial drainage was performed multiple times, but due to recurrence and size, a formal incision and drainage was done on November 3rd, 2025, with a Penrose drain placed. The seroma is improving with reduced swelling and drainage. The drain has retracted, creating a pocket, but the seroma is smaller and less swollen. No discoloration or signs of infection are present. Continue to leave the Penrose drain in place for another week to allow further drainage and drying. Use paper tape to secure the drain and prevent movement. Encourage use of a binder or supportive bra to minimize accumulation and provide compression. Schedule follow-up appointment in one week for potential removal of the Penrose drain.   PATIENT GOALS:   reduce lymphedema risk and learn post op HEP.   PAIN:  Are you having pain?  Some discomfort and pain with shoulder at 90 degrees abduction external rotation.  Facial expressions no number provided  PRECAUTIONS: patient to keep shoulder range of motion below 90 while drain is in place; right upper extremity lymphedema risk    HAND DOMINANCE:   WEIGHT BEARING RESTRICTIONS:   FALLS:  Has patient fallen in last 6 months? No  LIVING ENVIRONMENT: Patient lives with: Alone  OCCUPATION: Patient retired.  Used to work for [employer].  Patient lives alone.  Has 4 nieces in town that assist her with appointments.  Prior to surgery patient drives and does her own cooking and laundry.  And independent in bathing and dressing.  Patient has assessable walk-in shower.  LEISURE: Likes to read   OBJECTIVE:  COGNITION: Overall cognitive status: Within functional limits for tasks assessed    POSTURE:  Forward head and rounded shoulders posture  UPPER EXTREMITY AROM/PROM:   At eval Left shoulder range of motion to 90 degrees.  Patient  reports she had a shoulder fracture  in the past Right shoulder flexion and abduction to 90 degrees patient show facial grimace with discomfort and pain at the incision and pec muscle External rotation within functional range for patient to wash hands and wash hair CERVICAL AROM: All within normal limits:     UPPER EXTREMITY STRENGTH: Not tested   OT SESSION 07/24/24:  Patient arrived after seen about 2 weeks ago.  Drain was still in place. Since then drain came out.  Had a checkup with surgeon yesterday. Incisions closed. Did notice some skin folds trapping moisture.  Recommend for patient and caretaker to cane with her for patient to rest arm on back of chair to earlobe at that area.  For skin hygiene. Patient active range of motion per patient back to normal. Right shoulder flexion about 130 degrees abduction 110 but denies any pain or pull Patient reports she has been doing some exercises prior to the breast cancer onset. Recommend for patient to return to that doing range of motion and exercises. Patient circumference in bilateral upper extremities compared to each other within normal range. See below.  Her L-Dex score was in the yellow.  But because of patient's recent history with a seroma and having drains put in twice since initial surgery will repeat again the surgery in about a month to 6 weeks by breast navigator. Did recommend for patient and caretaker to look into getting knitting knockers that is available at the gift shop to put her in her bra for the mastectomy site-they are a little lighter than prosthesis.   LYMPHEDEMA ASSESSMENTS:   LYMPHEDEMA/ONCOLOGY QUESTIONNAIRE - 07/24/24 0001       Right Upper Extremity Lymphedema   15 cm Proximal to Olecranon Process 27 cm    10 cm Proximal to Olecranon Process 24.5 cm    Olecranon Process 22 cm    15 cm Proximal to Ulnar Styloid Process 21 cm    10 cm Proximal to Ulnar Styloid Process 18.8 cm    Just Proximal to Ulnar  Styloid Process 15.4 cm    Across Hand at [facility] 18.2 cm      Left Upper Extremity Lymphedema   15 cm Proximal to Olecranon Process 25 cm    10 cm Proximal to Olecranon Process 24.5 cm    Olecranon Process 22.5 cm    15 cm Proximal to Ulnar Styloid Process 20 cm           L-DEX LYMPHEDEMA SCREENING:  The patient was assessed using the L-Dex machine today to produce a lymphedema index baseline score. The patient will be reassessed on a regular basis (typically every 3 months) to obtain new L-Dex scores. If the score is > 6.5 points away from his/her baseline score indicating onset of subclinical lymphedema, it will be recommended to wear a compression garment for 4 weeks, 12 hours per day and then be reassessed. If the score continues to be > 6.5 points from baseline at reassessment, we will initiate lymphedema treatment. Assessing in this manner has a 95% rate of preventing clinically significant lymphedema.  L-DEX FLOWSHEETS - 07/24/24 0900       L-DEX LYMPHEDEMA SCREENING   Measurement Type Unilateral    L-DEX MEASUREMENT EXTREMITY Upper Extremity    POSITION  Standing    DOMINANT SIDE Right    At Risk Side Right    BASELINE SCORE (UNILATERAL) 9.2   Pt had seroma - 2nd drain as still in last visit -  PATIENT EDUCATION:  Education details: Lymphedema risk reduction and post op shoulder/posture HEP Person educated: Patient and nephew Education method: Explanation, Demonstration, Handout Education comprehension: Patient verbalized understanding and returned demonstration    ASSESSMENT:  CLINICAL IMPRESSION: Patient  postop right modified radical mastectomy on June 03, 2024 by Dr. Barnabas right chest seroma status post mastectomy -that was drained multiple times. - formal incision and drainage was done on November 3rd, 2025, with a Penrose drain placed.  Patient returns today for first follow-up.  Drain removed.  Incision closing.  Did  recommend for patient to elevate arm on the back of the chair to air little bit because of skin folds trapping moisture.  Also recommend to look into knitting knockers to wear does a little lighter than prosthesis.  Patient is active range of motion in right shoulder improved 210 for abduction and flexion 130 degrees.  Patient denies any pull or discomfort or pain.  Patient to return to prior level of function performing range of motion exercises and walking.  Patient circumference in right upper extremity decreased and within normal range to the left.  But her L-Dex score was increased in the yellow today but patient had twice putting a drain for seroma.  Would recommend breast navigator to repeat L-Dex score in about a month to 6 weeks to reassess.  She would benefit from monitoring lymphedema circumference and L-Dex score.  As well as continues needs and from L-Dex screens every 3 months for 2 years to detect subclinical lymphedema.  Pt will benefit from skilled therapeutic intervention to improve on the following deficits: Decreased knowledge of precautions and lymphedema education, impaired UE functional use, pain, decreased ROM, postural dysfunction.   OT treatment/interventions: ADL/self-care home management, pt/family education, therapeutic exercise,manual therapy  REHAB POTENTIAL: Good  CLINICAL DECISION MAKING: Stable/uncomplicated  EVALUATION COMPLEXITY: Low   GOALS: Goals reviewed with patient? YES  LONG TERM GOALS: (STG=LTG)    Name Target Date Goal status  1 Pt will be able to verbalize understanding of pertinent lymphedema risk reduction practices relevant to her dx specifically related to skin care.  Baseline:  No knowledge 8 weeks Progressing  2 Pt will be able to return demo and/or verbalize understanding of the post op HEP related to regaining shoulder ROM. Baseline:  No knowledge 8 weeks Met       4 Pt will demo she has regained full shoulder ROM and function post  operatively compared to baselines.  Baseline: See objective measurements taken today. 8 weeks Met    PLAN:  OT FREQUENCY/DURATION: EVAL and 2-5 visits  PLAN FOR   T Occupational Therapy Information for After Breast Cancer Surgery/Treatment:  Lymphedema is a swelling condition that you may be at risk for in your arm if you have lymph nodes removed from the armpit area.  After a sentinel node biopsy, the risk is approximately 5-9% and is higher after an axillary node dissection.  There is treatment available for this condition and it is not life-threatening.  Contact your physician or occupational therapist with concerns. You may begin the 4 shoulder/posture exercises (see additional sheet) when permitted by your physician (typically a week after surgery).  If you have drains, you may need to wait until those are removed before beginning range of motion exercises.  A general recommendation is to not lift your arms above shoulder height until drains are removed.  These exercises should be done to your tolerance and gently.  This is not a no pain/no gain type  of recovery so listen to your body and stretch into the range of motion that you can tolerate, stopping if you have pain.  If you are having immediate reconstruction, ask your plastic surgeon about doing exercises as he or she may want you to wait. .  While undergoing any medical procedure or treatment, try to avoid blood pressure being taken or needle sticks from occurring on the arm on the side of cancer.   This recommendation begins after surgery and continues for the rest of your life.  This may help reduce your risk of getting lymphedema (swelling in your arm). An excellent resource for those seeking information on lymphedema is the National [employer] Network's web site. It can be accessed at www.lymphnet.org If you notice swelling in your hand, arm or breast at any time following surgery (even if it is many years from now), please contact  your doctor or occupational therapist to discuss this.  Lymphedema can be treated at any time but it is easier for you if it is treated early on.  If you feel like your shoulder motion is not returning to normal in a reasonable amount of time, please contact your surgeon or occupational therapist.  [facility] [phone]. [address], [place], [place] [address]       Ancel Peters, OTR/L,CLT 07/24/2024, 9:21 AM

## 2024-08-26 ENCOUNTER — Inpatient Hospital Stay: Admitting: *Deleted

## 2024-08-26 ENCOUNTER — Encounter: Payer: Self-pay | Admitting: Oncology

## 2024-08-26 ENCOUNTER — Inpatient Hospital Stay: Attending: Oncology | Admitting: Oncology

## 2024-08-26 VITALS — BP 137/65 | HR 71 | Temp 95.6°F | Resp 19 | Ht 61.0 in | Wt 119.8 lb

## 2024-08-26 DIAGNOSIS — Z9189 Other specified personal risk factors, not elsewhere classified: Secondary | ICD-10-CM

## 2024-08-26 DIAGNOSIS — C50811 Malignant neoplasm of overlapping sites of right female breast: Secondary | ICD-10-CM

## 2024-08-26 DIAGNOSIS — Z79899 Other long term (current) drug therapy: Secondary | ICD-10-CM

## 2024-08-26 DIAGNOSIS — Z170 Estrogen receptor positive status [ER+]: Secondary | ICD-10-CM

## 2024-08-26 DIAGNOSIS — Z79811 Long term (current) use of aromatase inhibitors: Secondary | ICD-10-CM

## 2024-08-26 NOTE — Progress Notes (Signed)
 Patient has some concerns to address during today's visit.

## 2024-08-26 NOTE — Progress Notes (Signed)
 "    Hematology/Oncology Consult note [facility]  [phone] [phone]  Patient Care Team: Sadie Manna, MD as PCP - General (Internal Medicine) Georgina Shasta POUR, RN as Oncology Nurse Navigator Melanee Annah BROCKS, MD as Consulting Physician (Oncology) Lenn Aran, MD as Consulting Physician (Radiation Oncology)   Name of the patient: Martha  Munoz  [id number]  [birth year]   Date of visit: 08/26/2024  Diagnosis-  Cancer Staging  Malignant neoplasm of overlapping sites of right breast in female, estrogen receptor positive (HCC) Staging form: Breast, AJCC 8th Edition - Clinical: Stage IIIA (cT3, cN1, cM0, G2, ER+, PR-, HER2-) - Signed by Melanee Annah BROCKS, MD on 05/09/2024 Histologic grading system: 3 grade system - Pathologic stage from 06/21/2024: Stage IIIA (pT3, pN1a, cM0, G2, ER+, PR-, HER2-) - Signed by Melanee Annah BROCKS, MD on 06/21/2024 Stage prefix: Initial diagnosis Multigene prognostic tests performed: None Histologic grading system: 3 grade system    Chief complaint/ Reason for visit-routine follow-up of breast cancer  Heme/Onc history:  patient is a 89 year old female who found right breast mass over 6 months ago.  She underwent a diagnostic mammogram in August 2025 after she mentioned it to her primary care doctor.   Mammogram and ultrasound showed multiple contiguous appearing masses throughout the right upper outer and lower outer quadrants extending into the posterior retroareolar.  Spanning at least 5.6 cm.  Representative conglomeration of masses documented at 7:00 4 cm from the nipple which measures 4.6 x 2.7 x 4.9 cm.This extends and it appears contiguous with the retroareolar mass at 9 o'clock. An additional representative cluster of masses is documented at 10:30 8 cm from the nipple which measures 3.3 x 1.4 by 1.7 cm.  Single mildly enlarged right axillary lymph node   Patient underwent breast biopsy from right upper quadrant  and right lower quadrant as well as axillary lymph node biopsy.  This was consistent with invasive mammary carcinoma grade 2.  Immunohistochemical stain shows loss of E-cadherin expression consistent with lobular phenotype.  ER 100% positive PR negative Ki-67 5%.  Tumor cells were equivocal for HER2 and HER2 FISH testing was pending at the time of my visit   Patient is here with her niece today.She is independent of her ADLs.  No known family history of breast cancer.  Interval history-Martha  Lashauna Munoz is an 89 year old female with estrogen receptor positive breast cancer, status post mastectomy, presenting for oncology follow-up with cognitive impairment and concerns regarding bone health.  She underwent mastectomy for a 7 cm ER+ breast tumor and has been on adjuvant letrozole  since September 2025. She declined adjuvant chemotherapy and radiation. She has not experienced new breast symptoms, local recurrence, or systemic symptoms such as fever, weight loss, or night sweats. She remains physically functional, able to perform activities of daily living, but has limited her driving to essential trips.  Since her mastectomy and initiation of letrozole , she has experienced progressive memory impairment, including difficulty managing bills, recalling payment status, and navigating independently. These cognitive symptoms have worsened since surgery, though mild impairment was present prior. She has not previously been evaluated by neurology.  She is at increased risk for osteoporosis due to ongoing aromatase inhibitor therapy. She has not undergone a bone density scan and her baseline bone health is unknown. She denies history of fractures or bone pain.       ECOG PS- 2 Pain scale- 0   Review of systems- Review of Systems  Constitutional:  Negative for chills, fever,  malaise/fatigue and weight loss.  HENT:  Negative for congestion, ear discharge and nosebleeds.   Eyes:  Negative for blurred vision.   Respiratory:  Negative for cough, hemoptysis, sputum production, shortness of breath and wheezing.   Cardiovascular:  Negative for chest pain, palpitations, orthopnea and claudication.  Gastrointestinal:  Negative for abdominal pain, blood in stool, constipation, diarrhea, heartburn, melena, nausea and vomiting.  Genitourinary:  Negative for dysuria, flank pain, frequency, hematuria and urgency.  Musculoskeletal:  Negative for back pain, joint pain and myalgias.  Skin:  Negative for rash.  Neurological:  Negative for dizziness, tingling, focal weakness, seizures, weakness and headaches.  Endo/Heme/Allergies:  Does not bruise/bleed easily.  Psychiatric/Behavioral:  Negative for depression and suicidal ideas. The patient does not have insomnia.       Allergies[1]   Past Medical History:  Diagnosis Date   Adnexal mass    Anemia    Breast cancer in female [facility])    Colon cancer (HCC)    Depression    GERD (gastroesophageal reflux disease)    Hyperlipidemia    Hypertension    Skin cancer    Sleep apnea    not compliant with cpap     Past Surgical History:  Procedure Laterality Date   BREAST BIOPSY Right 90s   benign   BREAST BIOPSY Right 05/01/2024   US  RT BREAST BX W LOC DEV EA ADD LESION IMG BX SPEC US  GUIDE 05/01/2024 [facility]   BREAST BIOPSY Right 05/01/2024   US  RT BREAST BX W LOC DEV 1ST LESION IMG BX SPEC US  GUIDE 05/01/2024 [facility]   COLON RESECTION  1995   COLONOSCOPY     INCISION AND DRAINAGE, ABSCESS, BREAST Right 06/26/2024   Procedure: INCISION AND DRAINAGE, ABSCESS, BREAST;  Surgeon: Rodolph Romano, MD;  Location: [facility];  Service: General;  Laterality: Right;   MASTECTOMY MODIFIED RADICAL Right 06/03/2024   Procedure: MASTECTOMY, MODIFIED RADICAL;  Surgeon: Rodolph Romano, MD;  Location: [facility];  Service: General;  Laterality: Right;    Social History   Socioeconomic History   Marital status: Single    Spouse name: Not on  file   Number of children: Not on file   Years of education: Not on file   Highest education level: Not on file  Occupational History   Not on file  Tobacco Use   Smoking status: Former    Types: Cigarettes   Smokeless tobacco: Not on file  Vaping Use   Vaping status: Never Used  Substance and Sexual Activity   Alcohol use: Yes    Comment: martinia daily   Drug use: No   Sexual activity: Not on file  Other Topics Concern   Not on file  Social History Narrative   Not on file   Social Drivers of Health   Tobacco Use: Medium Risk (08/26/2024)   Patient History    Smoking Tobacco Use: Former    Smokeless Tobacco Use: Unknown    Passive Exposure: Not on Actuary Strain: Low Risk (05/08/2024)   Overall Financial Resource Strain (CARDIA)    Difficulty of Paying Living Expenses: Not hard at all  Food Insecurity: No Food Insecurity (06/04/2024)   Epic    Worried About Radiation Protection Practitioner of Food in the Last Year: Never true    [employer] of Food in the Last Year: Never true  Transportation Needs: No Transportation Needs (06/04/2024)   Epic    Lack of Transportation (Medical): No    Lack of Transportation (  Non-Medical): No  Physical Activity: Inactive (05/08/2024)   Exercise Vital Sign    Days of Exercise per Week: 0 days    Minutes of Exercise per Session: 0 min  Stress: No Stress Concern Present (05/08/2024)   [employer] of Occupational Health - Occupational Stress Questionnaire    Feeling of Stress: Not at all  Social Connections: Socially Integrated (06/04/2024)   Social Connection and Isolation Panel    Frequency of Communication with Friends and Family: More than three times a week    Frequency of Social Gatherings with Friends and Family: More than three times a week    Attends Religious Services: More than 4 times per year    Active Member of [employer] or Organizations: No    Attends Engineer, Structural: More than 4 times per year    Marital Status:  Married  Catering Manager Violence: Not At Risk (06/04/2024)   Epic    Fear of Current or Ex-Partner: No    Emotionally Abused: No    Physically Abused: No    Sexually Abused: No  Depression (PHQ2-9): Low Risk (08/26/2024)   Depression (PHQ2-9)    PHQ-2 Score: 0  Alcohol Screen: Not on file  Housing: Low Risk (06/04/2024)   Epic    Unable to Pay for Housing in the Last Year: No    Number of Times Moved in the Last Year: 0    Homeless in the Last Year: No  Utilities: Not At Risk (06/04/2024)   Epic    Threatened with loss of utilities: No  Health Literacy: Adequate Health Literacy (05/08/2024)   B1300 Health Literacy    Frequency of need for help with medical instructions: Never    Family History  Problem Relation Age of Onset   CVA Mother    Heart attack Father     Current Medications[2]  Physical exam:  Vitals:   08/26/24 1204  BP: 137/65  Pulse: 71  Resp: 19  Temp: (!) 95.6 F (35.3 C)  TempSrc: Tympanic  SpO2: 99%  Weight: 119 lb 12.8 oz (54.3 kg)  Height: 5' 1 (1.549 m)   Physical Exam   I have personally reviewed labs listed below:    Latest Ref Rng & Units 06/27/2024   11:20 AM  CMP  Glucose 70 - 99 mg/dL 891   BUN 8 - 23 mg/dL 34   Creatinine 9.55 - 1.00 mg/dL 8.94   Sodium 864 - 854 mmol/L 138   Potassium 3.5 - 5.1 mmol/L 4.8   Chloride 98 - 111 mmol/L 105   CO2 22 - 32 mmol/L 26   Calcium 8.9 - 10.3 mg/dL 8.6   Total Protein 6.5 - 8.1 g/dL 6.3   Total Bilirubin 0.0 - 1.2 mg/dL 0.9   Alkaline Phos 38 - 126 U/L 57   AST 15 - 41 U/L 26   ALT 0 - 44 U/L 12       Latest Ref Rng & Units 06/27/2024   11:20 AM  CBC  WBC 4.0 - 10.5 K/uL 7.5   Hemoglobin 12.0 - 15.0 g/dL 88.3   Hematocrit 63.9 - 46.0 % 36.2   Platelets 150 - 400 K/uL 243       Assessment and plan- Patient is a 89 y.o. female with history of pathological prognostic stage IIIa invasive lobular carcinoma of the right breast pT3 N1a M0 ER positive PR negative HER2 negative grade 2  s/p right mastectomy.  She did not receive adjuvant chemotherapy and declined  adjuvant radiation therapy.  She is here to discuss further management  Assessment and Plan    Estrogen receptor positive breast cancer status post mastectomy, on letrozole  Post-mastectomy for estrogen receptor positive breast carcinoma. Declined chemotherapy and radiation.  Moreover given her age and lobular histology the absolute benefit of chemo would be low.  She has been on letrozole  since September 2025 for recurrence risk reduction. Letrozole  chosen for minimal invasiveness.  - Based on recent cognitive decline I am not planning to add adjuvant CDK inhibitor Verzenio or Ribociclib at this time.  Patient and her niece are agreeable with this approach. - Continue letrozole  therapy at this time - Discussed option to discontinue letrozole  for two months to assess impact on cognitive function; plan is to continue for now. - Provided option to contact if she wishes to stop letrozole  before next visit. - Reassess in two months for further cognitive decline or other issues.  Cognitive impairment under evaluation Progressive memory impairment post-mastectomy. Etiology unclear; possible contributors include letrozole , postmenopausal state, or neurological causes. Impact of letrozole  on cognition uncertain. - Referred to neurology for evaluation of cognitive impairment. - Plan to reassess cognitive status in two months.  I am also referring her to [facility] neurology at this time  Risk for osteoporosis due to aromatase inhibitor therapy Letrozole  increases osteoporosis risk by inhibiting estrogen. Baseline bone health unknown; age-related bone loss likely. Bone density assessment needed. - Ordered bone density scan within the next couple of months to assess for osteopenia or osteoporosis. - Plan to address osteoporosis if diagnosed, including possible pharmacologic intervention.         Visit Diagnosis 1. Malignant neoplasm  of overlapping sites of right breast in female, estrogen receptor positive (HCC)   2. Use of letrozole  (Femara )   3. High risk medication use      Dr. Annah Skene, MD, MPH [facility] [phone] 08/26/2024 1:07 PM                   [1] No Known Allergies [2]  Current Outpatient Medications:    ibuprofen  (ADVIL ) 800 MG tablet, Take 1 tablet (800 mg total) by mouth every 8 (eight) hours as needed for mild pain (pain score 1-3) or moderate pain (pain score 4-6)., Disp: 30 tablet, Rfl: 0   letrozole  (FEMARA ) 2.5 MG tablet, Take 1 tablet (2.5 mg total) by mouth daily. (Patient taking differently: Take 2.5 mg by mouth at bedtime.), Disp: 30 tablet, Rfl: 3   LORazepam  (ATIVAN ) 0.5 MG tablet, Take by mouth., Disp: , Rfl:    losartan  (COZAAR ) 100 MG tablet, Take 25 mg by mouth at bedtime., Disp: , Rfl:    metoprolol  succinate (TOPROL -XL) 25 MG 24 hr tablet, Take 25 mg by mouth at bedtime., Disp: , Rfl:    simvastatin (ZOCOR) 20 MG tablet, Take 20 mg by mouth daily at 6 PM., Disp: , Rfl:    diazepam  (VALIUM ) 5 MG tablet, Take 1 tablet (5 mg total) by mouth every 6 (six) hours as needed. Take one tablet (5 mg total) by mouth before MRI's as needed. (Patient not taking: Reported on 08/26/2024), Disp: 30 tablet, Rfl: 0  "

## 2024-08-26 NOTE — Progress Notes (Signed)
 SUBJECTIVE: Pt returns for her 3 month L-Dex screen.    PAIN:  Are you having pain? No   SOZO SCREENING: Patient was assessed today using the SOZO machine to determine the lymphedema index score. This was compared to her baseline score. It was determined that she is within the recommended range when compared to her baseline and no further action is needed at this time. She will continue SOZO screenings. These are done every 3 months for 2 years post operatively followed by every 6 months for 2 years, and then annually.     L-DEX FLOWSHEETS                L-DEX LYMPHEDEMA SCREENING    Measurement Type Unilateral     L-DEX MEASUREMENT EXTREMITY Upper Extremity     POSITION  Standing     DOMINANT SIDE Right     At Risk Side Left     BASELINE SCORE (UNILATERAL) 9.2    L-DEX SCORE (UNILATERAL) 11.9    VALUE CHANGE (UNILAT) 2.7

## 2024-09-05 ENCOUNTER — Telehealth: Payer: Self-pay

## 2024-09-05 NOTE — Telephone Encounter (Signed)
 CSW Intern attempted to contact patient by phone per [facility] referral to assess for psychosocial needs. Intern left patient a voicemail with direct contact information and encouraged patient to call back.  Thersia Daring Clinical Social Work Intern [employer]

## 2024-09-11 ENCOUNTER — Other Ambulatory Visit

## 2024-09-16 ENCOUNTER — Other Ambulatory Visit

## 2024-09-20 ENCOUNTER — Ambulatory Visit
Admission: RE | Admit: 2024-09-20 | Discharge: 2024-09-20 | Disposition: A | Source: Ambulatory Visit | Attending: Oncology

## 2024-09-20 DIAGNOSIS — C50811 Malignant neoplasm of overlapping sites of right female breast: Secondary | ICD-10-CM | POA: Diagnosis present

## 2024-09-20 DIAGNOSIS — Z170 Estrogen receptor positive status [ER+]: Secondary | ICD-10-CM | POA: Diagnosis present

## 2024-09-20 DIAGNOSIS — M810 Age-related osteoporosis without current pathological fracture: Secondary | ICD-10-CM | POA: Insufficient documentation

## 2024-10-16 ENCOUNTER — Inpatient Hospital Stay: Admitting: Oncology

## 2024-11-25 ENCOUNTER — Inpatient Hospital Stay: Admitting: *Deleted
